# Patient Record
Sex: Male | Born: 1965 | Hispanic: Yes | Marital: Married | Smoking: Never smoker
Health system: Southern US, Community
[De-identification: ages and names within clinical notes are randomized; demographics above are authoritative.]

## PROBLEM LIST (undated history)

## (undated) DIAGNOSIS — I10 Essential (primary) hypertension: Secondary | ICD-10-CM

## (undated) HISTORY — PX: FOREARM SURGERY: SHX651

---

## 2022-04-01 ENCOUNTER — Encounter (HOSPITAL_BASED_OUTPATIENT_CLINIC_OR_DEPARTMENT_OTHER): Payer: Self-pay

## 2022-04-01 ENCOUNTER — Ambulatory Visit (HOSPITAL_BASED_OUTPATIENT_CLINIC_OR_DEPARTMENT_OTHER)
Admission: RE | Admit: 2022-04-01 | Discharge: 2022-04-01 | Disposition: A | Discharge status: Home / Self Care | Payer: 59 | Source: Ambulatory Visit | Attending: Family Medicine | Admitting: Family Medicine

## 2022-04-01 ENCOUNTER — Ambulatory Visit (INDEPENDENT_AMBULATORY_CARE_PROVIDER_SITE_OTHER): Payer: 59 | Admitting: Family Medicine

## 2022-04-01 ENCOUNTER — Encounter: Payer: Self-pay | Admitting: Family Medicine

## 2022-04-01 VITALS — BP 122/84 | Ht 66.0 in | Wt 180.0 lb

## 2022-04-01 DIAGNOSIS — M25531 Pain in right wrist: Secondary | ICD-10-CM | POA: Diagnosis present

## 2022-04-01 DIAGNOSIS — M47817 Spondylosis without myelopathy or radiculopathy, lumbosacral region: Secondary | ICD-10-CM

## 2022-04-01 DIAGNOSIS — M545 Low back pain, unspecified: Secondary | ICD-10-CM

## 2022-04-01 DIAGNOSIS — M8708 Idiopathic aseptic necrosis of bone, other site: Secondary | ICD-10-CM | POA: Diagnosis not present

## 2022-04-01 DIAGNOSIS — M8700 Idiopathic aseptic necrosis of unspecified bone: Secondary | ICD-10-CM | POA: Insufficient documentation

## 2022-04-01 MED ORDER — MELOXICAM 15 MG PO TABS: ORAL_TABLET | ORAL | 1 refills | Status: AC

## 2022-04-01 NOTE — Assessment & Plan Note (Signed)
Acute on chronic in nature.  Reports he had a fall in 2013 and had subsequent surgery.  Reports he received a steroid injection about 6 months ago but the pain has returned.  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  Unclear if he has previous surgery and that this pain is purely just related degenerative changes today. ?-Counseled on home exercise therapy and supportive care. ?-X-ray. ?-MR arthrogram to evaluate for avascular necrosis of the lunate and for presurgical planning. ?

## 2022-04-01 NOTE — Patient Instructions (Addendum)
Nice to meet you ?I have made a referral to physical therapy  ?We'll get the MRI at [facility]   ?Please send me a message in MyChart with any questions or updates.  ?We'll setup a virtual visit once the MRI is resulted .  ? ?--Dr. Raeford Razor ? ? ?Encantado de conocerlo ?He hecho una derivaci?n a fisioterapia. ?Haremos la resonancia magn?tica en [facility] ?Env?eme un mensaje en MyChart con cualquier pregunta o actualizaci?n. ?Programaremos una visita virtual una vez que se haya realizado la resonancia magn?tica. ?

## 2022-04-01 NOTE — Assessment & Plan Note (Signed)
Acute on chronic in nature.  Having degenerative changes of the facet joints. ?-Counseled on home exercise therapy and supportive care. ?-Referral to physical therapy. ?-Could consider further imaging. ?

## 2022-04-01 NOTE — Progress Notes (Signed)
?  Kevin Church - 56 y.o. male MRN [id number]  Date of birth: 08/18/1966 ? ?SUBJECTIVE:  Including CC & ROS.  ?No chief complaint on file. ? ? ?Kevin Church is a 56 y.o. male that is presenting with acute on chronic right wrist pain.  He had a fall from 10 years ago onto his wrist.  He had reported surgery at that time.  Presenting with pain that is occurring throughout the arm.  He is also presenting with acute on chronic low back pain. ? ?Interview was conducted with in person Spanish interpreter. ? ? ?Review of Systems ?See HPI  ? ?HISTORY: Past Medical, Surgical, Social, and Family History Reviewed & Updated per EMR.   ?Pertinent Historical Findings include: ? ?History reviewed. No pertinent past medical history. ? ?Past Surgical History:  ?Procedure Laterality Date  ? FOREARM SURGERY Right   ? ? ? ?PHYSICAL EXAM:  ?VS: BP 122/84 (BP Location: Left Arm, Patient Position: Sitting)   Ht 5\' 6"  (1.676 m)   Wt 180 lb (81.6 kg)   BMI 29.05 kg/m?  ?Physical Exam ?Gen: NAD, alert, cooperative with exam, well-appearing ?MSK:  ?Neurovascularly intact   ? ? ? ? ?ASSESSMENT & PLAN:  ? 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  Reports he had a fall in 2013 and had subsequent surgery.  Reports he received a steroid injection about 6 months ago but the pain has returned.  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  Unclear if he has previous surgery and that this pain is purely just related degenerative changes today. ?-Counseled on home exercise therapy and supportive care. ?-X-ray. ?-MR arthrogram to evaluate for avascular necrosis of the lunate and for presurgical planning. ? ?Facet arthropathy, lumbosacral ?Acute on chronic in nature.  Having degenerative changes of the facet joints. ?-Counseled on home exercise therapy and supportive care. ?-Referral to physical therapy. ?-Could consider further imaging. ? ? ? ? ?

## 2022-04-09 ENCOUNTER — Other Ambulatory Visit: Payer: Self-pay | Admitting: Family Medicine

## 2022-04-09 MED ORDER — DIAZEPAM 5 MG PO TABS: ORAL_TABLET | ORAL | 0 refills | Status: DC

## 2022-04-09 NOTE — Progress Notes (Signed)
Provided valium for MRI.  ? ?Myra Rude, MD ?[facility] ?04/09/2022, 3:52 PM ? ?

## 2022-04-13 ENCOUNTER — Ambulatory Visit (INDEPENDENT_AMBULATORY_CARE_PROVIDER_SITE_OTHER): Payer: 59

## 2022-04-13 ENCOUNTER — Ambulatory Visit: Payer: 59 | Admitting: Sports Medicine

## 2022-04-13 ENCOUNTER — Ambulatory Visit (INDEPENDENT_AMBULATORY_CARE_PROVIDER_SITE_OTHER): Payer: 59 | Admitting: Sports Medicine

## 2022-04-13 DIAGNOSIS — M87037 Idiopathic aseptic necrosis of right carpus: Secondary | ICD-10-CM | POA: Diagnosis not present

## 2022-04-13 DIAGNOSIS — M8700 Idiopathic aseptic necrosis of unspecified bone: Secondary | ICD-10-CM

## 2022-04-13 DIAGNOSIS — M25531 Pain in right wrist: Secondary | ICD-10-CM | POA: Diagnosis not present

## 2022-04-13 MED ORDER — GADOBUTROL 1 MMOL/ML IV SOLN
1.0000 mL | Freq: Once | INTRAVENOUS | Status: AC | PRN
  Administered 2022-04-13: 1 mL via INTRAVENOUS

## 2022-04-13 NOTE — Progress Notes (Signed)
? ? ?  Procedures performed today:   ? ?Procedure: Real-time Ultrasound Guided gadolinium contrast injection of right radiocarpal joint ?Device: Samsung HS60  ?Verbal informed consent obtained.  ?Time-out conducted.  ?Noted no overlying erythema, induration, or other signs of local infection.  ?Skin prepped in a sterile fashion.  ?Local anesthesia: Topical Ethyl chloride.  ?With sterile technique and under real time ultrasound guidance: Noted mild synovitis, 25-gauge needle advanced into the radiocarpal joint, injected 1/2 cc lidocaine, 1/2 cc kenalog 40, syringe switched and 0.05 gadolinium injected, syringe again switched and approximately 2 to 3 mL of sterile saline used to distend the joint. ?Joint visualized and capsule seen distending confirming intra-articular placement of contrast material and medication. ?Completed without difficulty  ?Advised to call if fevers/chills, erythema, induration, drainage, or persistent bleeding.  ?Images permanently stored in PACS ?Impression: Technically successful ultrasound guided gadolinium contrast injection for MR arthrography.  Please see separate MR arthrogram report. ? ? ?Independent interpretation of notes and tests performed by another provider:  ? ?None. ? ?Brief History, Exam, Impression, and Recommendations:   ? ?Avascular necrosis of bone of wrist ([place]) ?Patient seen by Dr. Raeford Razor, today we did an injection for MR arthrography, further management per primary treating provider. ? ? ? ?___________________________________________ ?Gwen Her. Dianah Field, M.D., ABFM., CAQSM. ?Primary Care and Sports Medicine ?[place] ? ?Adjunct Instructor of Family Medicine  ?[employer] ?

## 2022-04-13 NOTE — Assessment & Plan Note (Signed)
Patient seen by Dr. Raeford Razor, today we did an injection for MR arthrography, further management per primary treating provider. ?

## 2022-04-16 ENCOUNTER — Encounter: Payer: Self-pay | Admitting: Family Medicine

## 2022-04-16 ENCOUNTER — Ambulatory Visit: Payer: 59 | Admitting: Family Medicine

## 2022-04-16 VITALS — BP 140/88 | Ht 66.0 in | Wt 175.0 lb

## 2022-04-16 DIAGNOSIS — M8700 Idiopathic aseptic necrosis of unspecified bone: Secondary | ICD-10-CM

## 2022-04-16 DIAGNOSIS — M879 Osteonecrosis, unspecified: Secondary | ICD-10-CM

## 2022-04-16 NOTE — Assessment & Plan Note (Signed)
Acutely occurring.  The MRI of the straightening severe degenerative changes at the radiocarpal joint.  He has tried injections in the past. -Counseled on home exercise therapy and supportive care. -Provided brace today. -Referral to hand surgery.

## 2022-04-16 NOTE — Progress Notes (Signed)
  Kevin Church - 56 y.o. male MRN [id number]  Date of birth: 11-Nov-1966  SUBJECTIVE:  Including CC & ROS.  No chief complaint on file.   Kevin Church is a 56 y.o. male that is following up for the MRI of his right wrist.  The MRI of the wrist is demonstrating severe degenerative changes of the radiocarpal joint at the radiolunate articulation.  There is prominent subchondral cyst formation throughout the lunate and surrounding bone marrow edema..  An in person Spanish interpreter was used for this interview.  Review of Systems See HPI   HISTORY: Past Medical, Surgical, Social, and Family History Reviewed & Updated per EMR.   Pertinent Historical Findings include:  History reviewed. No pertinent past medical history.  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  The MRI of the straightening severe degenerative changes at the radiocarpal joint.  He has tried injections in the past. -Counseled on home exercise therapy and supportive care. -Provided brace today. -Referral to hand surgery.

## 2022-04-16 NOTE — Patient Instructions (Signed)
Good to see you I have sent in meloxicam.  Please check with the pharmacy to see if they received the prescription. Please use the brace as needed. I have made a referral to the hand surgeon. Please send me a message in MyChart with any questions or updates.  Please see me back as needed.   --Dr. Carlynn Spry bueno verte He enviado en meloxicam. Consulte con la farmacia para ver si recibieron la receta. Utilice la abrazadera segn sea necesario. He hecho una remisin al [employer]. Enveme un mensaje en MyChart con cualquier pregunta o actualizacin. Por favor, vuelva a verme cuando sea necesario.

## 2022-04-24 ENCOUNTER — Ambulatory Visit: Payer: 59 | Admitting: Orthopedic Surgery

## 2022-04-30 ENCOUNTER — Telehealth: Payer: Self-pay

## 2022-04-30 ENCOUNTER — Ambulatory Visit: Payer: 59 | Admitting: Orthopedic Surgery

## 2022-04-30 DIAGNOSIS — M19031 Primary osteoarthritis, right wrist: Secondary | ICD-10-CM | POA: Diagnosis not present

## 2022-04-30 DIAGNOSIS — M19039 Primary osteoarthritis, unspecified wrist: Secondary | ICD-10-CM

## 2022-04-30 DIAGNOSIS — M8708 Idiopathic aseptic necrosis of bone, other site: Secondary | ICD-10-CM

## 2022-04-30 DIAGNOSIS — M8700 Idiopathic aseptic necrosis of unspecified bone: Secondary | ICD-10-CM | POA: Diagnosis not present

## 2022-04-30 NOTE — Telephone Encounter (Signed)
I left a message at Kevin Church and Kevin Church for records for this patient. I didn't catch him before he left to have him sign anything, but if they call back this is why.

## 2022-05-01 DIAGNOSIS — M19039 Primary osteoarthritis, unspecified wrist: Secondary | ICD-10-CM | POA: Insufficient documentation

## 2022-05-01 NOTE — Progress Notes (Signed)
Office Visit Note   Patient: Kevin Church           Date of Birth: [birth year]           MRN: [id number] Visit Date: 04/30/2022              Requested by: Rosemarie Ax, MD [place] [address]  [place] [address] PCP: No primary care provider on file.   Assessment & Plan: Visit Diagnoses:  1. Avascular necrosis of bone of wrist ([place])   2. 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  Recent MRI shows collapse of the lunate without fragmentation but with complete loss of cartilage at the proximal articular surface of the lunate with some loss of cartilage at the lunate fossa of the radius.  We discussed treatment options including continued conservative management with bracing, oral anti-inflammatory medications, and corticosteroid injections versus surgery.  In terms of surgical procedures, he would most likely need either a proximal row carpectomy with or without capsular interposition depending on the state of the lunate fossa of the radius or a wrist arthrodesis.  He is a heavy labor which does plan to our decision making.  He had a previous procedure done in [place] and I would like to obtain those records before we decide what procedure to do.  It does seem like he had at least a PIN neurectomy based on the location of his previous incision.  We will work on taking his records and I will see him back after that point.  Follow-Up Instructions: No follow-ups on file.   Orders:  No orders of the defined types were placed in this encounter.  No orders of the defined types were placed in this encounter.     Procedures: No procedures performed   Clinical Data: No additional findings.   Subjective: Chief Complaint  Patient presents with   Right Wrist - Pain    This is a 56 year old right-hand-dominant male who works in [employer] and presents with  right wrist pain.  This started in 2013 after he fell from the ceiling onto his wrists.  He was seen at emergency room in [facility] and was told that he had a deformity of the bone and the bone did not have blood flow.  He subsequently developed right wrist pain and underwent corticosteroid injections in the wrist and the period between 2015 and 2016.  The injection lasted around 6 months.  The pain continued.  He had some sort of surgery in [place] but he is unsure which.  He does have a incision at the distal aspect of the forearm that seems consistent with a possible PIN neurectomy.  He was working with concrete and carpentry at time.  The surgery did not help him very much.  He has since been living with continued right wrist pain.  The wrist pain is diffuse but seems to be mostly in the dorsal central and radial aspects of the wrist.  He is taking meloxicam since the beginning of this month with some symptom improvement.  His pain is worse with prolonged activity.  He does have a brace that he wears for support.   Review of Systems   Objective: Vital Signs: There were no vitals taken for this visit.  Physical Exam Constitutional:      Appearance: Normal appearance.  Cardiovascular:     Rate and Rhythm:  Normal rate.     Pulses: Normal pulses.  Pulmonary:     Effort: Pulmonary effort is normal.  Skin:    General: Skin is warm and dry.     Capillary Refill: Capillary refill takes less than 2 seconds.  Neurological:     Mental Status: He is alert.    Right Hand Exam   Tenderness  Right hand tenderness location: TTP at dorsal central wrist, around anatomic snuffbox, and volar central wrist.  No obvious swelling.  Other  Erythema: absent Sensation: normal Pulse: present  Comments:  Wrist ROM 60/30 compared to 70/45 at contralateral side.  Near full pronation/supination.  No pain w/ [facility] grind test.  No wrist instability with ROM.       Specialty Comments:  No specialty  comments available.  Imaging: No results found.   PMFS History: Patient Active Problem List   Diagnosis Date Noted   Wrist arthritis 05/01/2022   Avascular necrosis of bone of wrist (Kevin Church) 04/01/2022   Facet arthropathy, lumbosacral 04/01/2022   No past medical history on file.  No family history on file.  Past Surgical History:  Procedure Laterality Date   FOREARM SURGERY Right    Social History   Occupational History   Not on file  Tobacco Use   Smoking status: Not on file   Smokeless tobacco: Not on file  Substance and Sexual Activity   Alcohol use: Not on file   Drug use: Not on file   Sexual activity: Not on file

## 2022-05-07 NOTE — Therapy (Incomplete)
OUTPATIENT PHYSICAL THERAPY THORACOLUMBAR EVALUATION   Patient Name: Kevin Church MRN: [id number] DOB:1966-04-15, 56 y.o., male Today's Date: 05/07/2022    No past medical history on file. 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  ONSET DATE: ***  SUBJECTIVE:                                                                                                                                                                                           SUBJECTIVE STATEMENT: ***  PERTINENT HISTORY:  R forearm sx, R wrist AVN and OA  PAIN:  Are you having pain? Yes: {yespain:27235::"NPRS scale: ***/10","Pain location: ***","Pain description: ***","Aggravating factors: ***","Relieving factors: ***"}   PRECAUTIONS: {Therapy precautions:24002}  WEIGHT BEARING RESTRICTIONS {Yes ***/No:24003}  FALLS:  Has patient fallen in last 6 months? {fallsyesno:27318}  LIVING ENVIRONMENT: Lives with: {OPRC lives with:25569::"lives with their family"} Lives in: {Lives in:25570} Stairs: {opstairs:27293} Has following equipment at home: {Assistive devices:23999}  OCCUPATION: ***  PLOF: {PLOF:24004}  PATIENT GOALS: ***   OBJECTIVE:   DIAGNOSTIC FINDINGS:  04/01/2022 - Lumbar x-ray: Mild lumbar disc degeneration.  Negative for fracture.  PATIENT SURVEYS:  {rehab surveys:24030}  SCREENING FOR RED FLAGS: Bowel or bladder incontinence: {Yes/No:304960894} Spinal tumors: {Yes/No:304960894} Cauda equina syndrome: {Yes/No:304960894} Compression  fracture: {Yes/No:304960894} Abdominal aneurysm: {Yes/No:304960894}  COGNITION:  Overall cognitive status: {cognition:24006}     SENSATION: {sensation:27233}  MUSCLE LENGTH: Hamstrings: Right *** deg; Left *** deg Thomas test: Right *** deg; Left *** deg  POSTURE: {posture:25561}  PALPATION: ***  LUMBAR ROM:   {AROM/PROM:27142}  A/PROM  eval  Flexion   Extension   Right lateral flexion   Left lateral flexion   Right rotation   Left rotation    (Blank rows = not tested)  LOWER EXTREMITY ROM:     {AROM/PROM:27142}  Right eval Left eval  Hip flexion    Hip extension    Hip abduction    Hip adduction    Hip internal rotation    Hip external rotation    Knee flexion    Knee extension    Ankle dorsiflexion    Ankle plantarflexion    Ankle inversion    Ankle eversion     (Blank rows =  not tested)  LOWER EXTREMITY MMT:    MMT Right eval Left eval  Hip flexion    Hip extension    Hip abduction    Hip adduction    Hip internal rotation    Hip external rotation    Knee flexion    Knee extension    Ankle dorsiflexion    Ankle plantarflexion    Ankle inversion    Ankle eversion     (Blank rows = not tested)  LUMBAR SPECIAL TESTS:  {lumbar special test:25242}  FUNCTIONAL TESTS:  {Functional tests:24029}  GAIT: Distance walked: *** Assistive device utilized: {Assistive devices:23999} Level of assistance: {Levels of assistance:24026} Comments: ***    TODAY'S TREATMENT  ***   PATIENT EDUCATION:  Education details: {Education details:27468} Person educated: {Person educated:25204} Education method: {Education Method:25205} Education comprehension: {Education Comprehension:25206}   HOME EXERCISE PROGRAM: ***  ASSESSMENT:  CLINICAL IMPRESSION: Kevin Church is a 56 y.o. male who was seen today for physical therapy evaluation and treatment for ***.    OBJECTIVE IMPAIRMENTS {opptimpairments:25111}.   ACTIVITY LIMITATIONS  {activitylimitations:27494}  PARTICIPATION LIMITATIONS: {participationrestrictions:25113}  PERSONAL FACTORS {Personal factors:25162} are also affecting patient's functional outcome.   REHAB POTENTIAL: {rehabpotential:25112}  CLINICAL DECISION MAKING: {clinical decision making:25114}  EVALUATION COMPLEXITY: {Evaluation complexity:25115}   GOALS: Goals reviewed with patient? {yes/no:20286}  SHORT TERM GOALS: Target date: {follow up:25551} (Remove Blue Hyperlink)  Patient will be independent with initial HEP.  Baseline: *** Goal status: {GOALSTATUS:25110}  2.  Patient will report centralization of radicular symptoms.  Baseline: *** Goal status: {GOALSTATUS:25110}  3.  *** Baseline: *** Goal status: {GOALSTATUS:25110}   LONG TERM GOALS: Target date: {follow up:25551}  (Remove Blue Hyperlink)  Patient will be independent with advanced/ongoing HEP to improve outcomes and carryover.  Baseline: *** Goal status: {GOALSTATUS:25110}  2.  Patient will report 75% improvement in low back pain to improve QOL.  Baseline: *** Goal status: {GOALSTATUS:25110}  3.  Patient to demonstrate ability to achieve and maintain good spinal alignment/posturing and body mechanics needed for daily activities. Baseline: *** Goal status: {GOALSTATUS:25110}  4.  Patient will demonstrate full pain free lumbar ROM to perform ADLs.   Baseline: *** Goal status: {GOALSTATUS:25110}  5.  Patient will demonstrate improved functional strength as demonstrated by ***. Baseline: *** Goal status: {GOALSTATUS:25110}  6.  Patient will report *** on lumbar FOTO to demonstrate improved functional ability.  Baseline: *** Goal status: {GOALSTATUS:25110}   7.  Patient will tolerate *** min of (standing/sitting/walking) to perform ***. Baseline: *** Goal status: {GOALSTATUS:25110}  8.  *** Baseline: *** Goal status: {GOALSTATUS:25110}    PLAN: PT FREQUENCY: {rehab frequency:25116}  PT DURATION: {rehab  duration:25117}  PLANNED INTERVENTIONS: {rehab planned interventions:25118::"Therapeutic exercises","Therapeutic activity","Neuromuscular re-education","Balance training","Gait training","Patient/Family education","Joint mobilization"}.  PLAN FOR NEXT SESSION: ***   Marry Guan, PT 05/07/2022, 2:28 PM

## 2022-05-08 ENCOUNTER — Ambulatory Visit: Payer: 59 | Attending: Family Medicine | Admitting: Physical Therapy

## 2022-05-08 DIAGNOSIS — R2689 Other abnormalities of gait and mobility: Secondary | ICD-10-CM | POA: Insufficient documentation

## 2022-05-08 DIAGNOSIS — M5416 Radiculopathy, lumbar region: Secondary | ICD-10-CM | POA: Insufficient documentation

## 2022-05-08 DIAGNOSIS — R29898 Other symptoms and signs involving the musculoskeletal system: Secondary | ICD-10-CM | POA: Insufficient documentation

## 2022-05-08 DIAGNOSIS — M5459 Other low back pain: Secondary | ICD-10-CM | POA: Insufficient documentation

## 2022-05-08 DIAGNOSIS — M62830 Muscle spasm of back: Secondary | ICD-10-CM | POA: Insufficient documentation

## 2022-05-14 NOTE — Therapy (Incomplete)
OUTPATIENT PHYSICAL THERAPY THORACOLUMBAR EVALUATION   Patient Name: Kevin Church MRN: [id number] DOB:[birth year], 56 y.o., male Today's Date: 05/14/2022    No past medical history on file. 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  ONSET DATE: ***  SUBJECTIVE:                                                                                                                                                                                           SUBJECTIVE STATEMENT: ***  PERTINENT HISTORY:  R forearm sx, R wrist AVN and OA  PAIN:  Are you having pain? Yes: {yespain:27235::"NPRS scale: ***/10","Pain location: ***","Pain description: ***","Aggravating factors: ***","Relieving factors: ***"}   PRECAUTIONS: {Therapy precautions:24002}  WEIGHT BEARING RESTRICTIONS {Yes ***/No:24003}  FALLS:  Has patient fallen in last 6 months? {fallsyesno:27318}  LIVING ENVIRONMENT: Lives with: {OPRC lives with:25569::"lives with their family"} Lives in: {Lives in:25570} Stairs: {opstairs:27293} Has following equipment at home: {Assistive devices:23999}  OCCUPATION: ***  PLOF: {PLOF:24004}  PATIENT GOALS: ***   OBJECTIVE:   DIAGNOSTIC FINDINGS:  04/01/2022 - Lumbar x-ray: Mild lumbar disc degeneration.  Negative for fracture.  PATIENT SURVEYS:  {rehab surveys:24030}  SCREENING FOR RED FLAGS: Bowel or bladder incontinence: {Yes/No:304960894} Spinal tumors: {Yes/No:304960894} Cauda equina syndrome: {Yes/No:304960894} Compression  fracture: {Yes/No:304960894} Abdominal aneurysm: {Yes/No:304960894}  COGNITION:  Overall cognitive status: {cognition:24006}     SENSATION: {sensation:27233}  MUSCLE LENGTH: Hamstrings: Right *** deg; Left *** deg Thomas test: Right *** deg; Left *** deg  POSTURE: {posture:25561}  PALPATION: ***  LUMBAR ROM:   {AROM/PROM:27142}  A/PROM  eval  Flexion   Extension   Right lateral flexion   Left lateral flexion   Right rotation   Left rotation    (Blank rows = not tested)  LOWER EXTREMITY ROM:     {AROM/PROM:27142}  Right eval Left eval  Hip flexion    Hip extension    Hip abduction    Hip adduction    Hip internal rotation    Hip external rotation    Knee flexion    Knee extension    Ankle dorsiflexion    Ankle plantarflexion    Ankle inversion    Ankle eversion     (Blank rows =  not tested)  LOWER EXTREMITY MMT:    MMT Right eval Left eval  Hip flexion    Hip extension    Hip abduction    Hip adduction    Hip internal rotation    Hip external rotation    Knee flexion    Knee extension    Ankle dorsiflexion    Ankle plantarflexion    Ankle inversion    Ankle eversion     (Blank rows = not tested)  LUMBAR SPECIAL TESTS:  {lumbar special test:25242}  FUNCTIONAL TESTS:  {Functional tests:24029}  GAIT: Distance walked: *** Assistive device utilized: {Assistive devices:23999} Level of assistance: {Levels of assistance:24026} Comments: ***    TODAY'S TREATMENT  ***   PATIENT EDUCATION:  Education details: {Education details:27468} Person educated: {Person educated:25204} Education method: {Education Method:25205} Education comprehension: {Education Comprehension:25206}   HOME EXERCISE PROGRAM: ***  ASSESSMENT:  CLINICAL IMPRESSION: Kevin Church is a 56 y.o. male who was seen today for physical therapy evaluation and treatment for ***.    OBJECTIVE IMPAIRMENTS {opptimpairments:25111}.   ACTIVITY LIMITATIONS  {activitylimitations:27494}  PARTICIPATION LIMITATIONS: {participationrestrictions:25113}  PERSONAL FACTORS {Personal factors:25162} are also affecting patient's functional outcome.   REHAB POTENTIAL: {rehabpotential:25112}  CLINICAL DECISION MAKING: {clinical decision making:25114}  EVALUATION COMPLEXITY: {Evaluation complexity:25115}   GOALS: Goals reviewed with patient? {yes/no:20286}  SHORT TERM GOALS: Target date: {follow up:25551} (Remove Blue Hyperlink)  Patient will be independent with initial HEP.  Baseline: *** Goal status: {GOALSTATUS:25110}  2.  Patient will report centralization of radicular symptoms.  Baseline: *** Goal status: {GOALSTATUS:25110}  3.  *** Baseline: *** Goal status: {GOALSTATUS:25110}   LONG TERM GOALS: Target date: {follow up:25551}  (Remove Blue Hyperlink)  Patient will be independent with advanced/ongoing HEP to improve outcomes and carryover.  Baseline: *** Goal status: {GOALSTATUS:25110}  2.  Patient will report 75% improvement in low back pain to improve QOL.  Baseline: *** Goal status: {GOALSTATUS:25110}  3.  Patient to demonstrate ability to achieve and maintain good spinal alignment/posturing and body mechanics needed for daily activities. Baseline: *** Goal status: {GOALSTATUS:25110}  4.  Patient will demonstrate full pain free lumbar ROM to perform ADLs.   Baseline: *** Goal status: {GOALSTATUS:25110}  5.  Patient will demonstrate improved functional strength as demonstrated by ***. Baseline: *** Goal status: {GOALSTATUS:25110}  6.  Patient will report *** on lumbar FOTO to demonstrate improved functional ability.  Baseline: *** Goal status: {GOALSTATUS:25110}   7.  Patient will tolerate *** min of (standing/sitting/walking) to perform ***. Baseline: *** Goal status: {GOALSTATUS:25110}  8.  *** Baseline: *** Goal status: {GOALSTATUS:25110}    PLAN: PT FREQUENCY: {rehab frequency:25116}  PT DURATION: {rehab  duration:25117}  PLANNED INTERVENTIONS: {rehab planned interventions:25118::"Therapeutic exercises","Therapeutic activity","Neuromuscular re-education","Balance training","Gait training","Patient/Family education","Joint mobilization"}.  PLAN FOR NEXT SESSION: ***   Marry Guan, PT 05/14/2022, 8:21 AM

## 2022-05-15 ENCOUNTER — Ambulatory Visit: Payer: 59

## 2022-05-22 ENCOUNTER — Encounter: Payer: Self-pay | Admitting: Physical Therapy

## 2022-05-22 ENCOUNTER — Other Ambulatory Visit: Payer: Self-pay

## 2022-05-22 ENCOUNTER — Ambulatory Visit: Payer: 59 | Admitting: Physical Therapy

## 2022-05-22 DIAGNOSIS — M62830 Muscle spasm of back: Secondary | ICD-10-CM | POA: Diagnosis present

## 2022-05-22 DIAGNOSIS — M5416 Radiculopathy, lumbar region: Secondary | ICD-10-CM | POA: Diagnosis present

## 2022-05-22 DIAGNOSIS — R2689 Other abnormalities of gait and mobility: Secondary | ICD-10-CM

## 2022-05-22 DIAGNOSIS — M5459 Other low back pain: Secondary | ICD-10-CM

## 2022-05-22 DIAGNOSIS — R29898 Other symptoms and signs involving the musculoskeletal system: Secondary | ICD-10-CM | POA: Diagnosis present

## 2022-05-25 ENCOUNTER — Ambulatory Visit: Payer: 59 | Admitting: Physical Therapy

## 2022-05-25 ENCOUNTER — Encounter: Payer: Self-pay | Admitting: Physical Therapy

## 2022-05-25 DIAGNOSIS — M62830 Muscle spasm of back: Secondary | ICD-10-CM

## 2022-05-25 DIAGNOSIS — M5459 Other low back pain: Secondary | ICD-10-CM | POA: Diagnosis not present

## 2022-05-25 DIAGNOSIS — R29898 Other symptoms and signs involving the musculoskeletal system: Secondary | ICD-10-CM

## 2022-05-25 DIAGNOSIS — M5416 Radiculopathy, lumbar region: Secondary | ICD-10-CM

## 2022-05-25 DIAGNOSIS — R2689 Other abnormalities of gait and mobility: Secondary | ICD-10-CM

## 2022-05-29 ENCOUNTER — Encounter: Payer: 59 | Admitting: Physical Therapy

## 2022-06-05 ENCOUNTER — Ambulatory Visit: Payer: 59 | Attending: Family Medicine | Admitting: Physical Therapy

## 2022-06-05 ENCOUNTER — Encounter: Payer: Self-pay | Admitting: Physical Therapy

## 2022-06-05 DIAGNOSIS — R2689 Other abnormalities of gait and mobility: Secondary | ICD-10-CM | POA: Diagnosis present

## 2022-06-05 DIAGNOSIS — M5459 Other low back pain: Secondary | ICD-10-CM | POA: Diagnosis present

## 2022-06-05 DIAGNOSIS — R29898 Other symptoms and signs involving the musculoskeletal system: Secondary | ICD-10-CM

## 2022-06-05 DIAGNOSIS — M5416 Radiculopathy, lumbar region: Secondary | ICD-10-CM

## 2022-06-05 DIAGNOSIS — M62830 Muscle spasm of back: Secondary | ICD-10-CM | POA: Diagnosis present

## 2022-06-05 NOTE — Therapy (Signed)
OUTPATIENT PHYSICAL THERAPY TREATMENT   Patient Name: Kevin Church MRN: [id number] DOB:1966/09/23, 56 y.o., male Today's Date: 06/05/2022   PT End of Session - 06/05/22 1006     Visit Number 3    Date for PT Re-Evaluation 07/03/22    Authorization Type [employer]    PT Start Time 1010    PT Stop Time 1059    PT Time Calculation (min) 49 min    Activity Tolerance Patient tolerated treatment well;Patient limited by pain    Behavior During Therapy [facility] for tasks assessed/performed              History reviewed. No pertinent past medical history. 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) 04/01/2022   Facet arthropathy, lumbosacral 04/01/2022    PCP: Not in system & patient cannot recall name  REFERRING PROVIDER: Myra Rude, MD  REFERRING DIAG: [phone] (ICD-10-CM) - Facet arthropathy, lumbosacral  RATIONALE FOR EVALUATION AND TREATMENT:  Rehabilitation  THERAPY DIAG:  Other low back pain  Radiculopathy, lumbar region  Other symptoms and signs involving the musculoskeletal system  Muscle spasm of back  ONSET DATE: ~1 month  SUBJECTIVE:                                                                                                                                                                                           SUBJECTIVE STATEMENT: Via spanish interpreter, pt reports continued pain. Pain is in low back and radiating to groin bil.  All of the exercises increase his pain and .   PAIN:  Are you having pain? Yes: NPRS scale: 8/10 Pain location: midline low back with pain and numbness down B posterior LEs to toes  Pain description: constant needles Aggravating factors: bending forward, sit to stand, exercise, playing/kicking ball with his kids Relieving factors: ibuprofen, ice  pack, Tiger balm, back brace  PERTINENT HISTORY:  R wrist AVN & OA - currently conservative treatment in wrist splint but being evaluated by ortho to determine potential procedural intervention, h/o R forearm sx  PRECAUTIONS: None  WEIGHT BEARING RESTRICTIONS: Yes - R wrist in splint due to OA/AVN  FALLS:  Has patient fallen in last 6 months? 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  Negative for fracture.  PATIENT SURVEYS:  FOTO Lumbar = 26, predicted = 65  SCREENING FOR RED FLAGS: Bowel or bladder incontinence: No Spinal tumors: No Cauda equina syndrome: No Compression fracture: No Abdominal aneurysm: No  COGNITION:  Overall cognitive status: Within functional limits for tasks assessed     SENSATION: WFL Intermittent B LE radicular pain and numbness down posterior LEs to toes  MUSCLE LENGTH:  Hamstrings: mod/severe tight B ITB: mod tight B Piriformis: mod tight B Hip flexors: mild tight B Quads: mild tight B  POSTURE: decreased lumbar lordosis  PALPATION: Increased muscle tension and TTP in B lower lumbar paraspinals, QL and to a lesser degree in B upper glutes Lower lumbar vertebrae hypomobile with CPAs with increased pain reported  LUMBAR ROM:   Active  AROM  Eval - 05/22/22  Flexion Hands to lower thighs - increased LBP & LE pain  Extension 50% limited - increased LBP but less LE pain  Right lateral flexion Hand to lower thigh - increased LBP  Left lateral flexion Hand to lower thigh - increased LBP  Right  rotation 50% limited - increased LBP   Left rotation 50% limited - increased LBP    (Blank rows = not tested)  LOWER EXTREMITY ROM:    B LE ROM grossly [facility]  LOWER EXTREMITY MMT:    MMT Right Eval 05/22/22 Left Eval 05/22/22  Hip flexion 4+ 4+  Hip extension 4 4  Hip abduction 4 4  Hip adduction 4 4  Hip internal rotation 4+ 4+  Hip external rotation 4 4  Knee flexion 4 4  Knee extension 4+ 4+  Ankle dorsiflexion 4 4  Ankle plantarflexion    Ankle inversion    Ankle eversion     (Blank rows = not tested) *Increased LBP with majority of resisted MMT  TODAY'S TREATMENT  06/05/2022  Therapeutic Exercise: to improve strength and mobility.  Demo and verbal cues throughout for technique. Nustep L4 x 8 min (taking subjective also educating on dry needling during this time.) Seated TMR based exercses - upper trunk twist preference to L, x 10, x 5 - pain started to increase.  Arm raises preference L x 10, second set started to increased LBP again.  Leg raises - preference L - x 5.   Mechanical  Lumbar Traction x  20 min (15 min treatment time) started after 50 lbs max slowly increasing to 80lbs max and 60 lb min, 60 sec on/10 sec rest, to decrease radicular symptoms and pain.  2 steps up, 2 steps down, 20 deg decline.    05/25/22 THERAPEUTIC EXERCISE: to improve flexibility, strength and mobility.  Verbal and tactile cues throughout for technique. Seated 3-way lumbar flexion stretch - increased pain reported TrA + PPT 10 x 5" - increased pain LTR 5 x 5' - increased pain POE 2 x 30 sec  MANUAL THERAPY: To promote normalized muscle tension, improved flexibility, improved joint mobility, and reduced pain. Manual longitudinal R/L LE distraction in supine - increased pain reported Hooklying manual lumbar traction/distraction - increased pain Foam roller to lumbar paraspinals - no increased or decreased pain Grade I-II CPAs & UPAs to lumbar vertebrae - increased pain  MODALITIES:  IFC  to B lumbar paraspinals, 80-150 Hz, intensity to patient tolerance x  10 min Ice pack to lumbar paraspinals x 10 min   05/22/22 THERAPEUTIC EXERCISE: Instruction in initial HEP (see below) to improve flexibility, strength and mobility.  Verbal and tactile cues throughout for technique. Static Prone on Elbows 2 x 30 sec Attempted prone press-up but unable due to R wrist Seated 3-way lumbar flexion stretch with hands supported on chair x 30 sec for each position   PATIENT EDUCATION:  Education details:  education on dry needling, given TMR exercises to be done to tolerance   Person educated: Patient Education method: Programmer, multimedia, Facilities manager, Verbal cues, and Handouts (handouts printed in Spanish) Education comprehension: verbalized understanding and needs further education   HOME EXERCISE PROGRAM: Access Code: [email or web address]  (Spanish translation) URL: [email or web address] Date: 05/22/2022 Prepared by: Glenetta Hew  Exercises - Static Prone on Elbows  - 3 x daily - 7 x weekly - 2 sets - 3 reps - 30-60 sec hold - Seated Flexion Stretch with Swiss Ball  - 2 x daily - 7 x weekly - 3 reps - 30 sec hold - Seated Thoracic Flexion and Rotation with Swiss Ball  - 2 x daily - 7 x weekly - 3 reps - 30 sec hold  ASSESSMENT:  CLINICAL IMPRESSION: Kowen reports has not done HEP due to pain and that he was told not to do exercises.  He reports continued severe LBP.  Today trialed TMR exercises focusing on side of preference, able to do 1 set of upper trunk twist, leg raise and arm raise on side of preference, but increased pain with second set in LB.  Educated on dry needling today as intervention in future appt.  We trialed mechanical lumbar traction today, starting at 50lbs max - he reported feeling no pull, so slowly increased pull to his tolerance, still below 50% body weight.  While he reported no pain or discomfort during traction (attended at all times by both PT and translator)  after traction ended complained of numbness down to toes.  After sitting up and ankle pumps, no numbness but reported worsened low back pain.  Offered modalities but declined "I just want to go home."  Contacted referring MD for him about severe pain and poor tolerance to exercise.  Given new exercises with instructions to perform on "good" side but stop when starts to hurt, aim is to start gentle movements without pain to improve exercise tolerance.  Noeh Hosollos-bamaca continues to demonstrate potential for improvement and would benefit from continued skilled therapy to address impairments.       OBJECTIVE IMPAIRMENTS Abnormal gait, decreased activity tolerance, decreased endurance, decreased knowledge of condition, decreased mobility, difficulty walking, decreased ROM, decreased strength, decreased safety awareness, increased fascial restrictions, impaired perceived functional ability, increased muscle spasms, impaired flexibility, impaired sensation, improper body mechanics, postural dysfunction, and pain.   ACTIVITY LIMITATIONS carrying, lifting, bending, sitting, standing, squatting, sleeping, stairs, transfers, bed mobility, locomotion level, and caring for others  PARTICIPATION LIMITATIONS: meal prep, cleaning, laundry, medication management, interpersonal relationship, driving, shopping, community activity, occupation, and yard work  PERSONAL Insurance underwriter, Fitness, Past/current experiences, Profession, Time since onset of injury/illness/exacerbation, and 1-2 comorbidities: R wrist AVN/OA and h/o R forearm sx  are also affecting patient's functional outcome.   REHAB POTENTIAL: Good  CLINICAL DECISION MAKING: Evolving/moderate complexity  EVALUATION COMPLEXITY: Moderate   GOALS: Goals reviewed with patient? Yes  SHORT TERM GOALS: Target date: 06/12/2022   Patient will be independent with initial HEP.  Baseline: Initial HEP provided on eval  Goal status: IN  PROGRESS  2.   Patient will report centralization of radicular symptoms.  Baseline: Near constant B LE sciatica with intermittent B LE numbness down posterior LEs to toes Goal status: IN PROGRESS  LONG TERM GOALS: Target date: 07/03/2022    Patient will be independent with advanced/ongoing HEP to improve outcomes and carryover.  Baseline: Initial HEP provided on eval  Goal status: IN PROGRESS  2.  Patient will report 75% improvement in low back pain to improve QOL.  Baseline: 9/10 Goal status: IN PROGRESS  3.  Patient to demonstrate ability to achieve and maintain good spinal alignment/posturing and body mechanics needed for daily activities. Baseline:  Goal status: IN PROGRESS  4.  Patient will demonstrate full pain free lumbar ROM to perform ADLs.   Baseline: Refer to above lumbar ROM chart Goal status: IN PROGRESS  5.  Patient will demonstrate improved B LE strength to >/= 4+/5 for improved stability and ease of mobility . Baseline: Refer to above LE MMT chart Goal status: IN PROGRESS  6.  Patient will report 80 on lumbar FOTO to demonstrate improved functional ability.  Baseline: 26 Goal status: IN PROGRESS   7.  Patient will report ability to play and kick a ball with his kids w/o limitation due to LBP or LE radiculopathy. Baseline: Unable to play with his kids d/t pain Goal status: IN PROGRESS  8.  Patient to report ability to perform ADLs, household, and work-related tasks without limitation due to LBP or LE radicular pain, LOM or weakness. Baseline: Limited with most daily tasks and unable to work d/t LBP/LE radiculopathy Goal status: IN PROGRESS    PLAN: PT FREQUENCY: 2x/week  PT DURATION: 6 weeks  PLANNED INTERVENTIONS: Therapeutic exercises, Therapeutic activity, Neuromuscular re-education, Balance training, Gait training, Patient/Family education, Joint mobilization, Aquatic Therapy, Dry Needling, Electrical stimulation, Spinal mobilization, Cryotherapy, Moist heat, Taping,  Traction, Ultrasound, Ionotophoresis 4mg /ml Dexamethasone, Manual therapy, and Re-evaluation.  PLAN FOR NEXT SESSION: trial of estim/TENS during exercises; review initial HEP & progress/modify as indicated; core/lumbopelvic mobilization and strengthening as tolerated ; MT +/- DN and/or modalities   , PT, DPT  06/05/2022, 11:58 AM

## 2022-06-09 ENCOUNTER — Ambulatory Visit: Payer: 59

## 2022-06-09 DIAGNOSIS — R29898 Other symptoms and signs involving the musculoskeletal system: Secondary | ICD-10-CM

## 2022-06-09 DIAGNOSIS — M62830 Muscle spasm of back: Secondary | ICD-10-CM

## 2022-06-09 DIAGNOSIS — M5459 Other low back pain: Secondary | ICD-10-CM | POA: Diagnosis not present

## 2022-06-09 DIAGNOSIS — R2689 Other abnormalities of gait and mobility: Secondary | ICD-10-CM

## 2022-06-09 DIAGNOSIS — M5416 Radiculopathy, lumbar region: Secondary | ICD-10-CM

## 2022-06-09 NOTE — Therapy (Signed)
OUTPATIENT PHYSICAL THERAPY TREATMENT   Patient Name: Kevin Church MRN: [id number] DOB:[birth year], 56 y.o., male Today's Date: 06/09/2022   PT End of Session - 06/09/22 1526     Visit Number 4    Date for PT Re-Evaluation 07/03/22    Authorization Type [employer]    PT Start Time 1449    PT Stop Time 1526    PT Time Calculation (min) 37 min    Activity Tolerance Patient tolerated treatment well;Patient limited by pain    Behavior During Therapy [facility] for tasks assessed/performed               History reviewed. No pertinent past medical history. 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) 04/01/2022   Facet arthropathy, lumbosacral 04/01/2022    PCP: Not in system & patient cannot recall name  REFERRING PROVIDER: Myra Rude, MD  REFERRING DIAG: [phone] (ICD-10-CM) - Facet arthropathy, lumbosacral  RATIONALE FOR EVALUATION AND TREATMENT:  Rehabilitation  THERAPY DIAG:  Other low back pain  Radiculopathy, lumbar region  Other symptoms and signs involving the musculoskeletal system  Muscle spasm of back  Other abnormalities of gait and mobility  ONSET DATE: ~1 month  SUBJECTIVE:                                                                                                                                                                                           SUBJECTIVE STATEMENT: Via spanish interpreter, pt reports constant LBP.  PAIN:  Are you having pain? Yes: NPRS scale: 9/10 Pain location: midline low back with pain and numbness down B posterior LEs to toes  Pain description: constant needles Aggravating factors: bending forward, sit to stand, exercise, playing/kicking ball with his kids Relieving factors: ibuprofen, ice pack, Tiger balm, back brace  PERTINENT HISTORY:   R wrist AVN & OA - currently conservative treatment in wrist splint but being evaluated by ortho to determine potential procedural intervention, h/o R forearm sx  PRECAUTIONS: None  WEIGHT BEARING RESTRICTIONS: Yes - R wrist in splint due to OA/AVN  FALLS:  Has patient fallen in last 6 months? 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  Negative for fracture.  PATIENT SURVEYS:  FOTO Lumbar = 26, predicted = 65  SCREENING FOR RED FLAGS: Bowel or bladder incontinence: No Spinal tumors: No Cauda equina syndrome: No Compression fracture: No Abdominal aneurysm: No  COGNITION:  Overall cognitive status: Within functional limits for tasks assessed     SENSATION: WFL Intermittent B LE radicular pain and numbness down posterior LEs to toes  MUSCLE LENGTH:  Hamstrings: mod/severe tight B ITB: mod tight B Piriformis: mod tight B Hip flexors: mild tight B Quads: mild tight B  POSTURE: decreased lumbar lordosis  PALPATION: Increased muscle tension and TTP in B lower lumbar paraspinals, QL and to a lesser degree in B upper glutes Lower lumbar vertebrae hypomobile with CPAs with increased pain reported  LUMBAR ROM:   Active  AROM  Eval - 05/22/22  Flexion Hands to lower thighs - increased LBP & LE pain  Extension 50% limited - increased LBP but less LE pain  Right lateral flexion Hand to lower thigh - increased LBP  Left lateral flexion Hand to lower thigh - increased LBP  Right rotation 50% limited - increased LBP   Left  rotation 50% limited - increased LBP    (Blank rows = not tested)  LOWER EXTREMITY ROM:    B LE ROM grossly [facility]  LOWER EXTREMITY MMT:    MMT Right Eval 05/22/22 Left Eval 05/22/22  Hip flexion 4+ 4+  Hip extension 4 4  Hip abduction 4 4  Hip adduction 4 4  Hip internal rotation 4+ 4+  Hip external rotation 4 4  Knee flexion 4 4  Knee extension 4+ 4+  Ankle dorsiflexion 4 4  Ankle plantarflexion    Ankle inversion    Ankle eversion     (Blank rows = not tested) *Increased LBP with majority of resisted MMT  TODAY'S TREATMENT  06/09/22 Therapeutic Exercise: Seated LAQ 2x10 bil Seated marching 2x10 bil Seated row red TB x 10 Seated shld extension red TB x 10 Seated hip ADD ball squeeze x 10 Seated row machine 15# x 10 Knee flexion 20# x 10  06/05/2022  Therapeutic Exercise: to improve strength and mobility.  Demo and verbal cues throughout for technique. Nustep L4 x 8 min (taking subjective also educating on dry needling during this time.) Seated TMR based exercses - upper trunk twist preference to L, x 10, x 5 - pain started to increase.  Arm raises preference L x 10, second set started to increased LBP again.  Leg raises - preference L - x 5.   Mechanical  Lumbar Traction x  20 min (15 min treatment time) started after 50 lbs max slowly increasing to 80lbs max and 60 lb min, 60 sec on/10 sec rest, to decrease radicular symptoms and pain.  2 steps up, 2 steps down, 20 deg decline.    05/25/22 THERAPEUTIC EXERCISE: to improve flexibility, strength and mobility.  Verbal and tactile cues throughout for technique. Seated 3-way lumbar flexion stretch - increased pain reported TrA + PPT 10 x 5" - increased pain LTR 5 x 5' - increased pain POE 2 x 30 sec  MANUAL THERAPY: To promote normalized muscle tension, improved flexibility, improved joint mobility, and reduced pain. Manual longitudinal R/L LE distraction in supine - increased pain reported Hooklying manual lumbar  traction/distraction - increased pain Foam roller to lumbar paraspinals - no  increased or decreased pain Grade I-II CPAs & UPAs to lumbar vertebrae - increased pain  MODALITIES:  IFC to B lumbar paraspinals, 80-150 Hz, intensity to patient tolerance x 10 min Ice pack to lumbar paraspinals x 10 min   05/22/22 THERAPEUTIC EXERCISE: Instruction in initial HEP (see below) to improve flexibility, strength and mobility.  Verbal and tactile cues throughout for technique. Static Prone on Elbows 2 x 30 sec Attempted prone press-up but unable due to R wrist Seated 3-way lumbar flexion stretch with hands supported on chair x 30 sec for each position   PATIENT EDUCATION:  Education details: HEP update (seated row and extension) Person educated: Patient Education method: Explanation, Demonstration, Verbal cues, and Handouts  Education comprehension: verbalized understanding and needs further education   HOME EXERCISE PROGRAM: Access Code: [email or web address]  (Spanish translation)   ASSESSMENT:  CLINICAL IMPRESSION: Pt showed a better tolerance of exercises today. We focused more on anything to get movement in his lower back, trying hip ROM and postural strengthening. He had no increased pain with any interventions but also no decrease in pain. Added scap stab exercises with TB to HEP as he seemed to do ok with these exercises. D/t exercises seemingly producing no improvement and remaining high pain levels, ended session early to allow pt and interpreter to schedule appointment with Dr. Jordan Likes to discuss further options to decrease pain in his lower back.      OBJECTIVE IMPAIRMENTS Abnormal gait, decreased activity tolerance, decreased endurance, decreased knowledge of condition, decreased mobility, difficulty walking, decreased ROM, decreased strength, decreased safety awareness, increased fascial restrictions, impaired perceived functional ability, increased muscle spasms, impaired flexibility, impaired  sensation, improper body mechanics, postural dysfunction, and pain.   ACTIVITY LIMITATIONS carrying, lifting, bending, sitting, standing, squatting, sleeping, stairs, transfers, bed mobility, locomotion level, and caring for others  PARTICIPATION LIMITATIONS: meal prep, cleaning, laundry, medication management, interpersonal relationship, driving, shopping, community activity, occupation, and yard work  PERSONAL Insurance underwriter, Fitness, Past/current experiences, Profession, Time since onset of injury/illness/exacerbation, and 1-2 comorbidities: R wrist AVN/OA and h/o R forearm sx  are also affecting patient's functional outcome.   REHAB POTENTIAL: Good  CLINICAL DECISION MAKING: Evolving/moderate complexity  EVALUATION COMPLEXITY: Moderate   GOALS: Goals reviewed with patient? Yes  SHORT TERM GOALS: Target date: 06/12/2022   Patient will be independent with initial HEP.  Baseline: Initial HEP provided on eval  Goal status: IN PROGRESS  2.  Patient will report centralization of radicular symptoms.  Baseline: Near constant B LE sciatica with intermittent B LE numbness down posterior LEs to toes Goal status: IN PROGRESS  LONG TERM GOALS: Target date: 07/03/2022    Patient will be independent with advanced/ongoing HEP to improve outcomes and carryover.  Baseline: Initial HEP provided on eval  Goal status: IN PROGRESS  2.  Patient will report 75% improvement in low back pain to improve QOL.  Baseline: 9/10 Goal status: IN PROGRESS  3.  Patient to demonstrate ability to achieve and maintain good spinal alignment/posturing and body mechanics needed for daily activities. Baseline:  Goal status: IN PROGRESS  4.  Patient will demonstrate full pain free lumbar ROM to perform ADLs.   Baseline: Refer to above lumbar ROM chart Goal status: IN PROGRESS  5.  Patient will demonstrate improved B LE strength to >/= 4+/5 for improved stability and ease of mobility . Baseline: Refer to  above LE MMT chart Goal status: IN PROGRESS  6.  Patient will report 26 on lumbar FOTO to demonstrate  improved functional ability.  Baseline: 26 Goal status: IN PROGRESS   7.  Patient will report ability to play and kick a ball with his kids w/o limitation due to LBP or LE radiculopathy. Baseline: Unable to play with his kids d/t pain Goal status: IN PROGRESS  8.  Patient to report ability to perform ADLs, household, and work-related tasks without limitation due to LBP or LE radicular pain, LOM or weakness. Baseline: Limited with most daily tasks and unable to work d/t LBP/LE radiculopathy Goal status: IN PROGRESS    PLAN: PT FREQUENCY: 2x/week  PT DURATION: 6 weeks  PLANNED INTERVENTIONS: Therapeutic exercises, Therapeutic activity, Neuromuscular re-education, Balance training, Gait training, Patient/Family education, Joint mobilization, Aquatic Therapy, Dry Needling, Electrical stimulation, Spinal mobilization, Cryotherapy, Moist heat, Taping, Traction, Ultrasound, Ionotophoresis 4mg /ml Dexamethasone, Manual therapy, and Re-evaluation.  PLAN FOR NEXT SESSION: trial of estim/TENS during exercises; review initial HEP & progress/modify as indicated; core/lumbopelvic mobilization and strengthening as tolerated ; MT +/- DN and/or modalities   , PTA 06/09/2022, 3:27 PM

## 2022-06-11 ENCOUNTER — Ambulatory Visit: Payer: 59

## 2022-06-11 DIAGNOSIS — M5459 Other low back pain: Secondary | ICD-10-CM

## 2022-06-11 DIAGNOSIS — M5416 Radiculopathy, lumbar region: Secondary | ICD-10-CM

## 2022-06-11 DIAGNOSIS — M62830 Muscle spasm of back: Secondary | ICD-10-CM

## 2022-06-11 DIAGNOSIS — R29898 Other symptoms and signs involving the musculoskeletal system: Secondary | ICD-10-CM

## 2022-06-11 DIAGNOSIS — R2689 Other abnormalities of gait and mobility: Secondary | ICD-10-CM

## 2022-06-11 NOTE — Therapy (Signed)
OUTPATIENT PHYSICAL THERAPY TREATMENT   Patient Name: Kevin Church MRN: [id number] DOB:August 12, 1966, 56 y.o., male Today's Date: 06/11/2022   PT End of Session - 06/11/22 1027     Visit Number 5    Date for PT Re-Evaluation 07/03/22    Authorization Type [employer]    PT Start Time [phone]    PT Stop Time 1022    PT Time Calculation (min) 51 min    Activity Tolerance Patient tolerated treatment well;Patient limited by pain    Behavior During Therapy [facility] for tasks assessed/performed                History reviewed. No pertinent past medical history. 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) 04/01/2022   Facet arthropathy, lumbosacral 04/01/2022    PCP: Not in system & patient cannot recall name  REFERRING PROVIDER: Myra Rude, MD  REFERRING DIAG: [phone] (ICD-10-CM) - Facet arthropathy, lumbosacral  RATIONALE FOR EVALUATION AND TREATMENT:  Rehabilitation  THERAPY DIAG:  Other low back pain  Radiculopathy, lumbar region  Other symptoms and signs involving the musculoskeletal system  Muscle spasm of back  Other abnormalities of gait and mobility  ONSET DATE: ~1 month  SUBJECTIVE:                                                                                                                                                                                           SUBJECTIVE STATEMENT: Via spanish interpreter, pt reports that he mowed the lawn yesterday because it had to be done, later in the night he hurt a lot and still hurts this morning. After every session he notes more pain after coming in.   PAIN:  Are you having pain? Yes: NPRS scale: 8/10 Pain location: midline low back with pain and numbness down B posterior LEs to toes  Pain description: constant needles Aggravating  factors: bending forward, sit to stand, exercise, playing/kicking ball with his kids Relieving factors: ibuprofen, ice pack, Tiger balm, back brace  PERTINENT HISTORY:  R wrist AVN & OA - currently conservative treatment in wrist splint but being evaluated by ortho to determine potential procedural intervention, h/o R forearm sx  PRECAUTIONS: None  WEIGHT BEARING RESTRICTIONS: Yes - R wrist in splint due to OA/AVN  FALLS:  Has patient fallen in last 6 months? 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  Negative for fracture.  PATIENT SURVEYS:  FOTO Lumbar = 26, predicted = 65  SCREENING FOR RED FLAGS: Bowel or bladder incontinence: No Spinal tumors: No Cauda equina syndrome: No Compression fracture: No Abdominal aneurysm: No  COGNITION:  Overall cognitive status: Within functional limits for tasks assessed     SENSATION: WFL Intermittent B LE radicular pain and numbness down posterior LEs to toes  MUSCLE LENGTH:  Hamstrings: mod/severe tight B ITB: mod tight B Piriformis: mod tight B Hip flexors: mild tight B Quads: mild tight B  POSTURE: decreased lumbar lordosis  PALPATION: Increased muscle tension and TTP in B lower lumbar paraspinals, QL and to a lesser degree in B upper glutes Lower lumbar vertebrae hypomobile with CPAs with increased pain reported  LUMBAR ROM:   Active  AROM  Eval - 05/22/22  Flexion Hands to lower thighs - increased LBP & LE pain  Extension 50% limited - increased LBP but less LE pain  Right  lateral flexion Hand to lower thigh - increased LBP  Left lateral flexion Hand to lower thigh - increased LBP  Right rotation 50% limited - increased LBP   Left rotation 50% limited - increased LBP    (Blank rows = not tested)  LOWER EXTREMITY ROM:    B LE ROM grossly [facility]  LOWER EXTREMITY MMT:    MMT Right Eval 05/22/22 Left Eval 05/22/22  Hip flexion 4+ 4+  Hip extension 4 4  Hip abduction 4 4  Hip adduction 4 4  Hip internal rotation 4+ 4+  Hip external rotation 4 4  Knee flexion 4 4  Knee extension 4+ 4+  Ankle dorsiflexion 4 4  Ankle plantarflexion    Ankle inversion    Ankle eversion     (Blank rows = not tested) *Increased LBP with majority of resisted MMT  TODAY'S TREATMENT  06/11/22 Therapeutic Exercise: L1x44min Gait for 170 ft 4 trials - 1 seated break required d/t LBP  Manual Therapy: STM to bil thoracolumbar paraspinals  Modalities: Estim to bil thoracolumbar paraspinals, 80-150 Hz, intensity to pt tolerance  06/09/22 Therapeutic Exercise: Seated LAQ 2x10 bil Seated marching 2x10 bil Seated row red TB x 10 Seated shld extension red TB x 10 Seated hip ADD ball squeeze x 10 Seated row machine 15# x 10 Knee flexion 20# x 10  06/05/2022  Therapeutic Exercise: to improve strength and mobility.  Demo and verbal cues throughout for technique. Nustep L4 x 8 min (taking subjective also educating on dry needling during this time.) Seated TMR based exercses - upper trunk twist preference to L, x 10, x 5 - pain started to increase.  Arm raises preference L x 10, second set started to increased LBP again.  Leg raises - preference L - x 5.   Mechanical  Lumbar Traction x  20 min (15 min treatment time) started after 50 lbs max slowly increasing to 80lbs max and 60 lb min, 60 sec on/10 sec rest, to decrease radicular symptoms and pain.  2 steps up, 2 steps down, 20 deg decline.     PATIENT EDUCATION:  Education details: HEP update (seated row and extension) Person  educated:  Patient Education method: Explanation, Demonstration, Verbal cues, and Handouts  Education comprehension: verbalized understanding and needs further education   HOME EXERCISE PROGRAM: Access Code: [id number]  (Spanish translation)   ASSESSMENT:  CLINICAL IMPRESSION: Adian reported session noting that he mowed the lawn which seemed to increase his lower back pain. I advised him to at least pace himself when mowing and to do more of intervals rather than all at once since he is the option for mowing his lawn. We tried gait today for exercise in hopes to decrease LBP, he had one instance of increased pain so we took a rest break, then continued. Due to his pain levels remaining the same, did MT to help decrease muscle spasm and pain which did bring down his pain. Tried estim post session, explained the rationale and instructed pt on the entire process, during estim pt noted decreased pain to 6/10. He will see PT next visit and we discussed that if he still does not see any improvement to let us know to discuss further action with PT.     OBJECTIVE IMPAIRMENTS Abnormal gait, decreased activity tolerance, decreased endurance, decreased knowledge of condition, decreased mobility, difficulty walking, decreased ROM, decreased strength, decreased safety awareness, increased fascial restrictions, impaired perceived functional ability, increased muscle spasms, impaired flexibility, impaired sensation, improper body mechanics, postural dysfunction, and pain.   ACTIVITY LIMITATIONS carrying, lifting, bending, sitting, standing, squatting, sleeping, stairs, transfers, bed mobility, locomotion level, and caring for others  PARTICIPATION LIMITATIONS: meal prep, cleaning, laundry, medication management, interpersonal relationship, driving, shopping, community activity, occupation, and yard work  PERSONAL Firefighter, Fitness, Past/current experiences, Profession, Time since onset of  injury/illness/exacerbation, and 1-2 comorbidities: R wrist AVN/OA and h/o R forearm sx  are also affecting patient's functional outcome.   REHAB POTENTIAL: Good  CLINICAL DECISION MAKING: Evolving/moderate complexity  EVALUATION COMPLEXITY: Moderate   GOALS: Goals reviewed with patient? Yes  SHORT TERM GOALS: Target date: 06/12/2022   Patient will be independent with initial HEP.  Baseline: Initial HEP provided on eval  Goal status: IN PROGRESS - pt limited by pain with exercises - 06/11/22  2.  Patient will report centralization of radicular symptoms.  Baseline: Near constant B LE sciatica with intermittent B LE numbness down posterior LEs to toes Goal status: IN PROGRESS - pt reports constant pain - 06/11/22  LONG TERM GOALS: Target date: 07/03/2022    Patient will be independent with advanced/ongoing HEP to improve outcomes and carryover.  Baseline: Initial HEP provided on eval  Goal status: IN PROGRESS  2.  Patient will report 75% improvement in low back pain to improve QOL.  Baseline: 9/10 Goal status: IN PROGRESS  3.  Patient to demonstrate ability to achieve and maintain good spinal alignment/posturing and body mechanics needed for daily activities. Baseline:  Goal status: IN PROGRESS  4.  Patient will demonstrate full pain free lumbar ROM to perform ADLs.   Baseline: Refer to above lumbar ROM chart Goal status: IN PROGRESS  5.  Patient will demonstrate improved B LE strength to >/= 4+/5 for improved stability and ease of mobility . Baseline: Refer to above LE MMT chart Goal status: IN PROGRESS  6.  Patient will report 36 on lumbar FOTO to demonstrate improved functional ability.  Baseline: 26 Goal status: IN PROGRESS   7.  Patient will report ability to play and kick a ball with his kids w/o limitation due to LBP or LE radiculopathy. Baseline: Unable to play with his kids d/t pain Goal status:  IN PROGRESS  8.  Patient to report ability to perform ADLs,  household, and work-related tasks without limitation due to LBP or LE radicular pain, LOM or weakness. Baseline: Limited with most daily tasks and unable to work d/t LBP/LE radiculopathy Goal status: IN PROGRESS    PLAN: PT FREQUENCY: 2x/week  PT DURATION: 6 weeks  PLANNED INTERVENTIONS: Therapeutic exercises, Therapeutic activity, Neuromuscular re-education, Balance training, Gait training, Patient/Family education, Joint mobilization, Aquatic Therapy, Dry Needling, Electrical stimulation, Spinal mobilization, Cryotherapy, Moist heat, Taping, Traction, Ultrasound, Ionotophoresis 4mg /ml Dexamethasone, Manual therapy, and Re-evaluation.  PLAN FOR NEXT SESSION: trial of estim/TENS during exercises; review initial HEP & progress/modify as indicated; core/lumbopelvic mobilization and strengthening as tolerated ; MT +/- DN and/or modalities   Artist Pais, PTA 06/11/2022, 10:29 AM

## 2022-06-15 ENCOUNTER — Ambulatory Visit: Payer: 59 | Admitting: Physical Therapy

## 2022-06-18 ENCOUNTER — Ambulatory Visit: Payer: 59

## 2022-06-18 ENCOUNTER — Ambulatory Visit (HOSPITAL_BASED_OUTPATIENT_CLINIC_OR_DEPARTMENT_OTHER)
Admission: RE | Admit: 2022-06-18 | Discharge: 2022-06-18 | Disposition: A | Discharge status: Home / Self Care | Payer: 59 | Source: Ambulatory Visit | Attending: Family Medicine | Admitting: Family Medicine

## 2022-06-18 ENCOUNTER — Ambulatory Visit (INDEPENDENT_AMBULATORY_CARE_PROVIDER_SITE_OTHER): Payer: 59 | Admitting: Family Medicine

## 2022-06-18 ENCOUNTER — Encounter: Payer: Self-pay | Admitting: Family Medicine

## 2022-06-18 VITALS — BP 108/64 | Ht 66.0 in | Wt 175.0 lb

## 2022-06-18 DIAGNOSIS — R29898 Other symptoms and signs involving the musculoskeletal system: Secondary | ICD-10-CM

## 2022-06-18 DIAGNOSIS — M5416 Radiculopathy, lumbar region: Secondary | ICD-10-CM | POA: Diagnosis not present

## 2022-06-18 DIAGNOSIS — M5459 Other low back pain: Secondary | ICD-10-CM | POA: Diagnosis not present

## 2022-06-18 DIAGNOSIS — M62830 Muscle spasm of back: Secondary | ICD-10-CM

## 2022-06-18 DIAGNOSIS — R1031 Right lower quadrant pain: Secondary | ICD-10-CM | POA: Insufficient documentation

## 2022-06-18 DIAGNOSIS — R2689 Other abnormalities of gait and mobility: Secondary | ICD-10-CM

## 2022-06-18 DIAGNOSIS — M47817 Spondylosis without myelopathy or radiculopathy, lumbosacral region: Secondary | ICD-10-CM

## 2022-06-18 MED ORDER — GABAPENTIN 300 MG PO CAPS: 300.0000 mg | ORAL_CAPSULE | Freq: Three times a day (TID) | ORAL | 1 refills | Status: AC

## 2022-06-18 NOTE — Progress Notes (Signed)
  Kevin Church - 56 y.o. male MRN [id number]  Date of birth: 1966/07/05  SUBJECTIVE:  Including CC & ROS.  No chief complaint on file.   Kevin Church is a 56 y.o. male that is presenting with acute on chronic low back pain with radicular pain.  He has been experiencing this pain for several months now.  He has started physical therapy back in May with no relief.  He is unable to work or do things around his house.  He has low back pain that radiates to his groin.  An in person Spanish interpreter was used for this interview  Review of Systems See HPI   HISTORY: Past Medical, Surgical, Social, and Family History Reviewed & Updated per EMR.   Pertinent Historical Findings include:  History reviewed. No pertinent past medical history.  Past Surgical History:  Procedure Laterality Date   FOREARM SURGERY Right      PHYSICAL EXAM:  VS: BP 108/64 (BP Location: Left Arm, Patient Position: Sitting)   Ht 5\' 6"  (1.676 m)   Wt 175 lb (79.4 kg)   BMI 28.25 kg/m  Physical Exam Gen: NAD, alert, cooperative with exam, well-appearing MSK:  Back/left leg Pain with flexion and extension. Weakness with resistance to hip flexion on the left. Diminished deep tendon reflexes at the patella and Achilles. Positive straight leg raise Neurovascularly intact       ASSESSMENT & PLAN:   Lumbar radiculopathy Acute on chronic in nature.  He is having pain and altered sensation that radiates to the groin area.  As well as down the leg.  He has been in physical therapy beginning of May.  Still having pain despite medications. -Counseled on home exercise therapy and supportive care. -Gabapentin. -MRI of lumbar spine evaluate for nerve impingement and consideration of epidural use.  Facet arthropathy, lumbosacral Continues to have low back pain with changes observed on previous imaging. -Counseled on home exercise therapy and supportive care. -Hold on physical therapy. -Could  consider facet injections  Right inguinal pain Acutely occurring.  He does endorse pain that goes to the groin.  Possible for radicular pain versus coming from his hip joints. -Counseled on home exercise therapy and supportive care. -AP of the pelvis

## 2022-06-18 NOTE — Assessment & Plan Note (Signed)
Continues to have low back pain with changes observed on previous imaging. -Counseled on home exercise therapy and supportive care. -Hold on physical therapy. -Could consider facet injections

## 2022-06-18 NOTE — Assessment & Plan Note (Signed)
Acute on chronic in nature.  He is having pain and altered sensation that radiates to the groin area.  As well as down the leg.  He has been in physical therapy beginning of May.  Still having pain despite medications. -Counseled on home exercise therapy and supportive care. -Gabapentin. -MRI of lumbar spine evaluate for nerve impingement and consideration of epidural use.

## 2022-06-18 NOTE — Therapy (Addendum)
OUTPATIENT PHYSICAL THERAPY TREATMENT / DISCHARGE SUMMARY   Patient Name: Kevin Church MRN: [id number] DOB:[birth year], 56 y.o., male Today's Date: 06/18/2022   PT End of Session - 06/18/22 1055     Visit Number 6    Date for PT Re-Evaluation 07/03/22    Authorization Type [employer]    PT Start Time 1017    PT Stop Time 1051   pt requested to end early   PT Time Calculation (min) 34 min    Activity Tolerance Patient limited by pain    Behavior During Therapy [facility] for tasks assessed/performed                 History reviewed. No pertinent past medical history. 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   Avascular necrosis of bone of wrist ([place]) 04/01/2022   Facet arthropathy, lumbosacral 04/01/2022    PCP: Not in system & patient cannot recall name  REFERRING PROVIDER: Rosemarie Ax, MD  REFERRING DIAG: [phone] (ICD-10-CM) - Facet arthropathy, lumbosacral  RATIONALE FOR EVALUATION AND TREATMENT:  Rehabilitation  THERAPY DIAG:  Other low back pain  Radiculopathy, lumbar region  Other symptoms and signs involving the musculoskeletal system  Muscle spasm of back  Other abnormalities of gait and mobility  ONSET DATE: ~1 month  SUBJECTIVE:                                                                                                                                                                                           SUBJECTIVE STATEMENT: Via spanish interpreter, pt reports that he was working in the gutters when he bent over and hurt his back worse. Therapy still has not been helping much  PAIN:  Are you having pain? Yes: NPRS scale: 8/10 Pain location: midline low back with pain and numbness down B posterior LEs to toes  Pain description: constant needles Aggravating factors: bending forward, sit to stand, exercise,  playing/kicking ball with his kids Relieving factors: ibuprofen, ice pack, Tiger balm, back brace  PERTINENT HISTORY:  R wrist AVN & OA - currently conservative treatment in wrist splint but being evaluated by ortho to determine potential procedural intervention, h/o R forearm sx  PRECAUTIONS: None  WEIGHT BEARING RESTRICTIONS: Yes - R wrist in splint due to OA/AVN  FALLS:  Has patient fallen in last 6 months? 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  Negative for fracture.  PATIENT SURVEYS:  FOTO Lumbar = 26, predicted = 65  SCREENING FOR RED FLAGS: Bowel or bladder incontinence: No Spinal tumors: No Cauda equina syndrome: No Compression fracture: No Abdominal aneurysm: No  COGNITION:  Overall cognitive status: Within functional limits for tasks assessed     SENSATION: WFL Intermittent B LE radicular pain and numbness down posterior LEs to toes  MUSCLE LENGTH:  Hamstrings: mod/severe tight B ITB: mod tight B Piriformis: mod tight B Hip flexors: mild tight B Quads: mild tight B  POSTURE: decreased lumbar lordosis  PALPATION: Increased muscle tension and TTP in B lower lumbar paraspinals, QL and to a lesser degree in B upper glutes Lower lumbar vertebrae hypomobile with CPAs with increased pain reported  LUMBAR ROM:   Active  AROM  Eval - 05/22/22  Flexion Hands to lower thighs - increased LBP & LE pain  Extension 50% limited - increased LBP but less LE pain  Right lateral flexion Hand to lower thigh - increased  LBP  Left lateral flexion Hand to lower thigh - increased LBP  Right rotation 50% limited - increased LBP   Left rotation 50% limited - increased LBP    (Blank rows = not tested)  LOWER EXTREMITY ROM:    B LE ROM grossly WFL  LOWER EXTREMITY MMT:    MMT Right Eval 05/22/22 Left Eval 05/22/22  Hip flexion 4+ 4+  Hip extension 4 4  Hip abduction 4 4  Hip adduction 4 4  Hip internal rotation 4+ 4+  Hip external rotation 4 4  Knee flexion 4 4  Knee extension 4+ 4+  Ankle dorsiflexion 4 4  Ankle plantarflexion    Ankle inversion    Ankle eversion     (Blank rows = not tested) *Increased LBP with majority of resisted MMT  TODAY'S TREATMENT  720/23 Therapeutic Exercise: Gait for 200 ft - 1 seated break required d/t back pain  Manual Therapy: IASTM (foam roll) and STM to B lumbar paraspinals, glutes - pain requested to end MT and just do estim  Modalities: Modalities: Estim to bil thoracolumbar paraspinals, frequency: 80-150 Hz, intensity to pt tolerance (12) 06/11/22 Therapeutic Exercise: L1x55mn Gait for 170 ft 4 trials - 1 seated break required d/t LBP  Manual Therapy: STM to bil thoracolumbar paraspinals  Modalities: Estim to bil thoracolumbar paraspinals, 80-150 Hz, intensity to pt tolerance  06/09/22 Therapeutic Exercise: Seated LAQ 2x10 bil Seated marching 2x10 bil Seated row red TB x 10 Seated shld extension red TB x 10 Seated hip ADD ball squeeze x 10 Seated row machine 15# x 10 Knee flexion 20# x 10  06/05/2022  Therapeutic Exercise: to improve strength and mobility.  Demo and verbal cues throughout for technique. Nustep L4 x 8 min (taking subjective also educating on dry needling during this time.) Seated TMR based exercses - upper trunk twist preference to L, x 10, x 5 - pain started to increase.  Arm raises preference L x 10, second set started to increased LBP again.  Leg raises - preference L - x 5.   Mechanical  Lumbar Traction x  20 min (15 min  treatment time) started after 50 lbs max  slowly increasing to 80lbs max and 60 lb min, 60 sec on/10 sec rest, to decrease radicular symptoms and pain.  2 steps up, 2 steps down, 20 deg decline.     PATIENT EDUCATION:  Education details: HEP update (seated row and extension) Person educated: Patient Education method: Explanation, Demonstration, Verbal cues, and Handouts  Education comprehension: verbalized understanding and needs further education   HOME EXERCISE PROGRAM: Access Code: [email or web address]  (Spanish translation)   ASSESSMENT:  CLINICAL IMPRESSION: Pt arrived with c/o of continued pain in lower back down to coccyx. We tried gait as a warm up to reduce stiffness but this did not help the pain. We then tried STM to reduce muscle spasms but afterwards pt requested to just do estim because it did help with pain last visit. Applied estim to lower back for 9 min until pt noted that pain remained the same and requested to end session afterward. He will see Dr. Raeford Razor today @ 2:45, so far no TE, MT, or modalities seem to be producing any improvements.     OBJECTIVE IMPAIRMENTS Abnormal gait, decreased activity tolerance, decreased endurance, decreased knowledge of condition, decreased mobility, difficulty walking, decreased ROM, decreased strength, decreased safety awareness, increased fascial restrictions, impaired perceived functional ability, increased muscle spasms, impaired flexibility, impaired sensation, improper body mechanics, postural dysfunction, and pain.   ACTIVITY LIMITATIONS carrying, lifting, bending, sitting, standing, squatting, sleeping, stairs, transfers, bed mobility, locomotion level, and caring for others  PARTICIPATION LIMITATIONS: meal prep, cleaning, laundry, medication management, interpersonal relationship, driving, shopping, community activity, occupation, and yard work  PERSONAL Firefighter, Fitness, Past/current experiences, Profession, Time since onset of  injury/illness/exacerbation, and 1-2 comorbidities: R wrist AVN/OA and h/o R forearm sx  are also affecting patient's functional outcome.   REHAB POTENTIAL: Good  CLINICAL DECISION MAKING: Evolving/moderate complexity  EVALUATION COMPLEXITY: Moderate   GOALS: Goals reviewed with patient? Yes  SHORT TERM GOALS: Target date: 06/12/2022   Patient will be independent with initial HEP.  Baseline: Initial HEP provided on eval  Goal status: NOT MET - pt limited by pain with exercises - 06/11/22  2.  Patient will report centralization of radicular symptoms.  Baseline: Near constant B LE sciatica with intermittent B LE numbness down posterior LEs to toes Goal status: NOT MET - pt reports constant pain - 06/11/22  LONG TERM GOALS: Target date: 07/03/2022    Patient will be independent with advanced/ongoing HEP to improve outcomes and carryover.  Baseline: Initial HEP provided on eval  Goal status: NOT MET  2.  Patient will report 75% improvement in low back pain to improve QOL.  Baseline: 9/10 Goal status: NOT MET  3.  Patient to demonstrate ability to achieve and maintain good spinal alignment/posturing and body mechanics needed for daily activities. Baseline:  Goal status: NOT MET  4.  Patient will demonstrate full pain free lumbar ROM to perform ADLs.   Baseline: Refer to above lumbar ROM chart Goal status: NOT MET  5.  Patient will demonstrate improved B LE strength to >/= 4+/5 for improved stability and ease of mobility . Baseline: Refer to above LE MMT chart Goal status: NOT MET  6.  Patient will report 34 on lumbar FOTO to demonstrate improved functional ability.  Baseline: 26 Goal status: NOT MET   7.  Patient will report ability to play and kick a ball with his kids w/o limitation due to LBP or LE radiculopathy. Baseline: Unable to play with his kids d/t pain Goal status: NOT MET  8.  Patient to report ability to perform ADLs, household, and work-related tasks without  limitation due to LBP or LE radicular pain, LOM or weakness. Baseline: Limited with most daily tasks and unable to work d/t LBP/LE radiculopathy Goal status: NOT MET    PLAN: PT FREQUENCY: 2x/week  PT DURATION: 6 weeks  PLANNED INTERVENTIONS: Therapeutic exercises, Therapeutic activity, Neuromuscular re-education, Balance training, Gait training, Patient/Family education, Joint mobilization, Aquatic Therapy, Dry Needling, Electrical stimulation, Spinal mobilization, Cryotherapy, Moist heat, Taping, Traction, Ultrasound, Ionotophoresis 69m/ml Dexamethasone, Manual therapy, and Re-evaluation.  PLAN FOR NEXT SESSION: trial of estim/TENS during exercises; review initial HEP & progress/modify as indicated; core/lumbopelvic mobilization and strengthening as tolerated ; MT +/- DN and/or modalities   Kevin Church, PTA 06/18/2022, 11:00 AM   PHYSICAL THERAPY DISCHARGE SUMMARY  Visits from Start of Care: 6  Current functional level related to goals / functional outcomes:   Refer to above clinical impression and goal assessment for status as of last visit on 06/18/22. Patient saw MD following last PT visit and MD discontinued PT due to pt reporting no benefit, therefore will proceed with discharge from PT for this episode.     Remaining deficits:   Ongoing LBP/sacrococcygeal pain with limited activity tolerance not responding to PT interventions.   Education / Equipment:   HEP; info on home TENS unit  Patient agrees to discharge. Patient goals were not met. Patient is being discharged due to the physician's request.  JPercival Spanish PT, MPT 07/28/22, 1:33 PM  CB[facility] LLC[address] [place] [address]Phone: [phone]  Fax:  [phone]

## 2022-06-18 NOTE — Patient Instructions (Signed)
Good to see you You can place a hold on Physical therapy for now  Please start with 1 pill of gabapentin at night.  You can increase to 2-3 times daily as you tolerate. I will call with the x-ray results from today. We will get the MRI from the med center in [place]. Please send me a message in MyChart with any questions or updates.  We will set up a virtual visit once the MRI is resulted.   --Dr. Jordan Likes

## 2022-06-18 NOTE — Assessment & Plan Note (Signed)
Acutely occurring.  He does endorse pain that goes to the groin.  Possible for radicular pain versus coming from his hip joints. -Counseled on home exercise therapy and supportive care. -AP of the pelvis

## 2022-06-22 ENCOUNTER — Ambulatory Visit: Payer: 59 | Admitting: Physical Therapy

## 2022-06-23 ENCOUNTER — Ambulatory Visit (INDEPENDENT_AMBULATORY_CARE_PROVIDER_SITE_OTHER): Payer: 59 | Admitting: Orthopedic Surgery

## 2022-06-23 DIAGNOSIS — M8708 Idiopathic aseptic necrosis of bone, other site: Secondary | ICD-10-CM

## 2022-06-23 DIAGNOSIS — M19039 Primary osteoarthritis, unspecified wrist: Secondary | ICD-10-CM

## 2022-06-23 DIAGNOSIS — M19031 Primary osteoarthritis, right wrist: Secondary | ICD-10-CM

## 2022-06-23 DIAGNOSIS — M8700 Idiopathic aseptic necrosis of unspecified bone: Secondary | ICD-10-CM | POA: Diagnosis not present

## 2022-06-23 MED ORDER — BETAMETHASONE SOD PHOS & ACET 6 (3-3) MG/ML IJ SUSP
6.0000 mg | INTRAMUSCULAR | Status: AC | PRN
  Administered 2022-06-23: 6 mg via INTRA_ARTICULAR

## 2022-06-23 MED ORDER — LIDOCAINE HCL 1 % IJ SOLN
1.0000 mL | INTRAMUSCULAR | Status: AC | PRN
  Administered 2022-06-23: 1 mL

## 2022-06-23 NOTE — Progress Notes (Signed)
Office Visit Note   Patient: Kevin Church           Date of Birth: 1966/02/23           MRN: [id number] Visit Date: 06/23/2022              Requested by: No referring provider defined for this encounter. PCP: System, Provider Not In   Assessment & Plan: Visit Diagnoses:  1. Avascular necrosis of bone of wrist (HCC)   2. Wrist arthritis     Plan: Patient has continued pain in the wrist with radiolunate osteoarthritis with MRI findings of lunate collapse without fragmentation and significant cartilage loss at the proximal lunate and lunate fossa of the radius.  We again reviewed the treatment options for this problem including continued conservative management with bracing, oral anti-inflammatory medications, and trying a corticosteroid injection.  We also also discussed surgery with proximal row carpectomy with capsular interposition versus arthrodesis.  He would like to try corticosteroid injection for now as his previous injection gave him at least 6 months of her symptom relief.  I can see him back again when the symptoms return.  Follow-Up Instructions: No follow-ups on file.   Orders:  No orders of the defined types were placed in this encounter.  No orders of the defined types were placed in this encounter.     Procedures: Medium Joint Inj: R radiocarpal on 06/23/2022 5:25 PM Indications: joint swelling Details: 25 G 1.5 in needle, dorsal approach Medications: 1 mL lidocaine 1 %; 6 mg betamethasone acetate-betamethasone sodium phosphate 6 (3-3) MG/ML Procedure, treatment alternatives, risks and benefits explained, specific risks discussed. Consent was given by the patient. Immediately prior to procedure a time out was called to verify the correct patient, procedure, equipment, support staff and site/side marked as required. Patient was prepped and draped in the usual sterile fashion.       Clinical Data: No additional findings.   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  He was last seen about a month and a half ago which time the plan was for him to obtain the records from his previous surgery in [place].  He is unable to obtain his records as they need the release to be from a physician or physician's office.  Just to review, he fell on his right wrist in 2013 from the ceiling.  He is seen at the emergency room in [facility] and was told he had bone deformity with loss of blood flow to this bone.  He developed right wrist pain underwent corticosteroid injection in the wrist back in 20 15-20 16.  He had 6 months of relief.  He had some sort of surgery in [place] but he is not sure what.  He had an incision at the distal aspect of the forearm that seems consistent with at least a PIN neurectomy but is unsure what else he had done.  He has been living with the pain.  The pain is localized to the dorsal central aspect of the right wrist.  He is taking meloxicam with some symptom improvement.  He has a wet brace that he wears for support.    Review of Systems   Objective: Vital Signs: There were no vitals taken for this visit.  Physical Exam  Right Hand Exam  Tenderness  Right hand tenderness location: TTP at dorsal central wrist, around snuffbox, and volar wrist.  No swelling.  Range of Motion  Wrist  Right wrist extension: 60.  Right wrist flexion: 30.  Pronation:  normal  Supination:  normal       Specialty Comments:  No specialty comments available.  Imaging: No results found.   PMFS History: Patient Active Problem List   Diagnosis Date Noted   Lumbar radiculopathy 06/18/2022   Right inguinal pain 06/18/2022   Wrist arthritis 05/01/2022   Avascular necrosis of bone of wrist (HCC) 04/01/2022   Facet arthropathy, lumbosacral 04/01/2022    No past medical history on file.  No family history on file.  Past Surgical History:  Procedure Laterality Date   FOREARM SURGERY Right    Social History   Occupational History   Not on file  Tobacco Use   Smoking status: Not on file   Smokeless tobacco: Not on file  Substance and Sexual Activity   Alcohol use: Not on file   Drug use: Not on file   Sexual activity: Not on file

## 2022-06-25 ENCOUNTER — Encounter: Payer: 59 | Admitting: Physical Therapy

## 2022-07-01 ENCOUNTER — Other Ambulatory Visit: Payer: Self-pay | Admitting: Family Medicine

## 2022-07-01 MED ORDER — DIAZEPAM 5 MG PO TABS: ORAL_TABLET | ORAL | 0 refills | Status: AC

## 2022-07-01 NOTE — Progress Notes (Signed)
Provided valium for mri.   Myra Rude, MD [facility] 07/01/2022, 11:03 AM

## 2022-07-04 ENCOUNTER — Ambulatory Visit (INDEPENDENT_AMBULATORY_CARE_PROVIDER_SITE_OTHER): Payer: Commercial Managed Care - HMO

## 2022-07-04 DIAGNOSIS — M5416 Radiculopathy, lumbar region: Secondary | ICD-10-CM | POA: Diagnosis not present

## 2022-07-08 ENCOUNTER — Telehealth: Payer: Self-pay | Admitting: Family Medicine

## 2022-07-08 NOTE — Telephone Encounter (Signed)
Multiple message left for pt to contact [facility] for  MRI  results appt scheduling.  --glh

## 2022-07-17 ENCOUNTER — Ambulatory Visit: Payer: Commercial Managed Care - HMO | Admitting: Family Medicine

## 2022-07-17 ENCOUNTER — Encounter: Payer: Self-pay | Admitting: Family Medicine

## 2022-07-17 VITALS — BP 130/90 | Ht 66.0 in | Wt 175.0 lb

## 2022-07-17 DIAGNOSIS — M5416 Radiculopathy, lumbar region: Secondary | ICD-10-CM

## 2022-07-17 NOTE — Progress Notes (Signed)
  Humzah Hosollos-bamaca - 56 y.o. male MRN [id number]  Date of birth: 11-25-1966  SUBJECTIVE:  Including CC & ROS.  No chief complaint on file.   Montarius Hosollos-bamaca is a 56 y.o. male that is following up for the MRI of his lumbar spine.  Continues to have pain in the low back as well as the groin.  A telephone Spanish interpreter was used for this interview   Review of Systems See HPI   HISTORY: Past Medical, Surgical, Social, and Family History Reviewed & Updated per EMR.   Pertinent Historical Findings include:  History reviewed. No pertinent past medical history.  Past Surgical History:  Procedure Laterality Date   FOREARM SURGERY Right      PHYSICAL EXAM:  VS: BP (!) 130/90 (BP Location: Left Arm, Patient Position: Sitting)   Ht 5\' 6"  (1.676 m)   Wt 175 lb (79.4 kg)   BMI 28.25 kg/m  Physical Exam Gen: NAD, alert, cooperative with exam, well-appearing MSK:  Neurovascularly intact       ASSESSMENT & PLAN:   Lumbar radiculopathy Acutely occurring.  The MRI was reassuring but he does have a disc bulge at L4-5. -Counseled on home exercise therapy and supportive care. -CBC and CMP. -Pursue epidural.  May need to consider nerve study.

## 2022-07-17 NOTE — Patient Instructions (Signed)
Good to see you Please continue the gabapentin and mobic  We will get some baseline labs to make sure your kidney function is good  I have sent an order for an epidural   Please send me a message in MyChart with any questions or updates.  Please give me a call one week after the epidural to know if your pain is improved.   --Dr. Carlynn Spry bueno verte Por favor contine con la gabapentina y mobic Obtendremos algunos anlisis de referencia para asegurarnos de que su funcin renal sea buena. He enviado un pedido para una epidural. Enveme un mensaje en MyChart con cualquier pregunta o actualizacin. Llmeme una semana despus de la epidural para saber si su dolor ha mejorado.

## 2022-07-17 NOTE — Assessment & Plan Note (Signed)
Acutely occurring.  The MRI was reassuring but he does have a disc bulge at L4-5. -Counseled on home exercise therapy and supportive care. -CBC and CMP. -Pursue epidural.  May need to consider nerve study.

## 2022-07-18 LAB — CBC
Hematocrit: 42.3 % (ref 37.5–51.0)
Hemoglobin: 14.3 g/dL (ref 13.0–17.7)
MCH: 30.2 pg (ref 26.6–33.0)
MCHC: 33.8 g/dL (ref 31.5–35.7)
MCV: 89 fL (ref 79–97)
Platelets: 214 10*3/uL (ref 150–450)
RBC: 4.74 x10E6/uL (ref 4.14–5.80)
RDW: 13.4 % (ref 11.6–15.4)
WBC: 6.5 10*3/uL (ref 3.4–10.8)

## 2022-07-18 LAB — COMPREHENSIVE METABOLIC PANEL
ALT: 37 IU/L (ref 0–44)
AST: 20 IU/L (ref 0–40)
Albumin/Globulin Ratio: 2 (ref 1.2–2.2)
Albumin: 4.7 g/dL (ref 3.8–4.9)
Alkaline Phosphatase: 88 IU/L (ref 44–121)
BUN/Creatinine Ratio: 15 (ref 9–20)
BUN: 14 mg/dL (ref 6–24)
Bilirubin Total: 0.4 mg/dL (ref 0.0–1.2)
CO2: 22 mmol/L (ref 20–29)
Calcium: 9.7 mg/dL (ref 8.7–10.2)
Chloride: 102 mmol/L (ref 96–106)
Creatinine, Ser: 0.94 mg/dL (ref 0.76–1.27)
Globulin, Total: 2.4 g/dL (ref 1.5–4.5)
Glucose: 105 mg/dL — ABNORMAL HIGH (ref 70–99)
Potassium: 4.1 mmol/L (ref 3.5–5.2)
Sodium: 139 mmol/L (ref 134–144)
Total Protein: 7.1 g/dL (ref 6.0–8.5)
eGFR: 96 mL/min/{1.73_m2} (ref >59)

## 2022-07-20 ENCOUNTER — Telehealth: Payer: Self-pay | Admitting: Family Medicine

## 2022-07-20 NOTE — Telephone Encounter (Signed)
Informed of results.  Used a Research officer, trade union.  Myra Rude, MD [facility] 07/20/2022, 4:31 PM

## 2022-07-23 ENCOUNTER — Encounter: Payer: Self-pay | Admitting: Orthopedic Surgery

## 2022-07-23 ENCOUNTER — Ambulatory Visit: Payer: Commercial Managed Care - HMO | Admitting: Orthopedic Surgery

## 2022-07-23 DIAGNOSIS — M19039 Primary osteoarthritis, unspecified wrist: Secondary | ICD-10-CM

## 2022-07-24 NOTE — Progress Notes (Signed)
   Office Visit Note   Patient: Kevin Church           Date of Birth: [birth year]           MRN: [id number] Visit Date: 07/23/2022              Requested by: No referring provider defined for this encounter. PCP: System, Provider Not In   Assessment & Plan: Visit Diagnoses: No diagnosis found.  Plan: Patient has significant radiocarpal arthritis with sclerosis and collapse of the lunate without fragmentation.  He underwent CSI a month ago with very modest symptom relief.  Given that he underwent CSI so recently, we discussed that he needs to allow more time to pass before undergoing any surgical procedure if he were interested surgical wrist reconstruction.  He will continue to wear his brace and continue oral anti-inflammatory medication.   Follow-Up Instructions: No follow-ups on file.   Orders:  No orders of the defined types were placed in this encounter.  No orders of the defined types were placed in this encounter.     Procedures: No procedures performed   Clinical Data: No additional findings.   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 The pain remains at the dorsal central and radial wrist.  He underwent CSI around one month ago.  He remains symptomatic.         Review of Systems   Objective: Vital Signs: There were no vitals taken for this visit.  Physical Exam  Right Hand Exam   Tenderness  Right hand tenderness location: TTP at dorsal central and radial aspect of radiocarpal joint.  Mild to moderate swelling.  Other  Erythema: absent Sensation: normal Pulse: present  Comments:  Wrist ROM severely limited by pain.       Specialty Comments:  No specialty comments available.  Imaging: No results found.   PMFS History: Patient Active Problem List    Diagnosis Date Noted   Lumbar radiculopathy 06/18/2022   Right inguinal pain 06/18/2022   Wrist arthritis 05/01/2022   Avascular necrosis of bone of wrist (HCC) 04/01/2022   Facet arthropathy, lumbosacral 04/01/2022   History reviewed. No pertinent past medical history.  History reviewed. No pertinent family history.  Past Surgical History:  Procedure Laterality Date   FOREARM SURGERY Right    Social History   Occupational History   Not on file  Tobacco Use   Smoking status: Not on file   Smokeless tobacco: Not on file  Substance and Sexual Activity   Alcohol use: Not on file   Drug use: Not on file   Sexual activity: Not on file

## 2023-03-17 ENCOUNTER — Encounter: Payer: Self-pay | Admitting: *Deleted

## 2023-06-05 ENCOUNTER — Emergency Department (HOSPITAL_BASED_OUTPATIENT_CLINIC_OR_DEPARTMENT_OTHER)
Admission: EM | Admit: 2023-06-05 | Discharge: 2023-06-05 | Disposition: A | Discharge status: Home / Self Care | Payer: Commercial Managed Care - HMO | Attending: Emergency Medicine | Admitting: Emergency Medicine

## 2023-06-05 ENCOUNTER — Encounter (HOSPITAL_BASED_OUTPATIENT_CLINIC_OR_DEPARTMENT_OTHER): Payer: Self-pay

## 2023-06-05 ENCOUNTER — Emergency Department (HOSPITAL_BASED_OUTPATIENT_CLINIC_OR_DEPARTMENT_OTHER): Payer: Commercial Managed Care - HMO

## 2023-06-05 ENCOUNTER — Other Ambulatory Visit: Payer: Self-pay

## 2023-06-05 DIAGNOSIS — I10 Essential (primary) hypertension: Secondary | ICD-10-CM | POA: Insufficient documentation

## 2023-06-05 DIAGNOSIS — R0602 Shortness of breath: Secondary | ICD-10-CM | POA: Diagnosis not present

## 2023-06-05 DIAGNOSIS — R079 Chest pain, unspecified: Secondary | ICD-10-CM | POA: Diagnosis present

## 2023-06-05 DIAGNOSIS — R1013 Epigastric pain: Secondary | ICD-10-CM | POA: Insufficient documentation

## 2023-06-05 DIAGNOSIS — Z79899 Other long term (current) drug therapy: Secondary | ICD-10-CM | POA: Diagnosis not present

## 2023-06-05 HISTORY — DX: Essential (primary) hypertension: I10

## 2023-06-05 LAB — COMPREHENSIVE METABOLIC PANEL
ALT: 32 U/L (ref 0–44)
AST: 26 U/L (ref 15–41)
Albumin: 4.2 g/dL (ref 3.5–5.0)
Alkaline Phosphatase: 80 U/L (ref 38–126)
Anion gap: 10 (ref 5–15)
BUN: 21 mg/dL — ABNORMAL HIGH (ref 6–20)
CO2: 24 mmol/L (ref 22–32)
Calcium: 10.1 mg/dL (ref 8.9–10.3)
Chloride: 103 mmol/L (ref 98–111)
Creatinine, Ser: 0.88 mg/dL (ref 0.61–1.24)
GFR, Estimated: >60 mL/min (ref >60)
Glucose, Bld: 142 mg/dL — ABNORMAL HIGH (ref 70–99)
Potassium: 3.7 mmol/L (ref 3.5–5.1)
Sodium: 137 mmol/L (ref 135–145)
Total Bilirubin: 0.7 mg/dL (ref 0.3–1.2)
Total Protein: 7.3 g/dL (ref 6.5–8.1)

## 2023-06-05 LAB — CBC WITH DIFFERENTIAL/PLATELET
Abs Immature Granulocytes: 0.02 10*3/uL (ref 0.00–0.07)
Basophils Absolute: 0 10*3/uL (ref 0.0–0.1)
Basophils Relative: 1 %
Eosinophils Absolute: 0.2 10*3/uL (ref 0.0–0.5)
Eosinophils Relative: 3 %
HCT: 42.9 % (ref 39.0–52.0)
Hemoglobin: 14.3 g/dL (ref 13.0–17.0)
Immature Granulocytes: 0 %
Lymphocytes Relative: 49 %
Lymphs Abs: 3.4 10*3/uL (ref 0.7–4.0)
MCH: 29.3 pg (ref 26.0–34.0)
MCHC: 33.3 g/dL (ref 30.0–36.0)
MCV: 87.9 fL (ref 80.0–100.0)
Monocytes Absolute: 0.5 10*3/uL (ref 0.1–1.0)
Monocytes Relative: 7 %
Neutro Abs: 2.7 10*3/uL (ref 1.7–7.7)
Neutrophils Relative %: 40 %
Platelets: 223 10*3/uL (ref 150–400)
RBC: 4.88 MIL/uL (ref 4.22–5.81)
RDW: 13.6 % (ref 11.5–15.5)
WBC: 6.8 10*3/uL (ref 4.0–10.5)
nRBC: 0 % (ref 0.0–0.2)

## 2023-06-05 LAB — LIPASE, BLOOD: Lipase: 34 U/L (ref 11–51)

## 2023-06-05 LAB — BRAIN NATRIURETIC PEPTIDE: B Natriuretic Peptide: 57.4 pg/mL (ref 0.0–100.0)

## 2023-06-05 LAB — TROPONIN I (HIGH SENSITIVITY)
Troponin I (High Sensitivity): 4 ng/L (ref <18)
Troponin I (High Sensitivity): 4 ng/L (ref <18)

## 2023-06-05 MED ORDER — ALUM & MAG HYDROXIDE-SIMETH 200-200-20 MG/5ML PO SUSP
30.0000 mL | Freq: Once | ORAL | Status: AC
  Administered 2023-06-05: 30 mL via ORAL
  Filled 2023-06-05: qty 30

## 2023-06-05 MED ORDER — SUCRALFATE 1 G PO TABS: 1.0000 g | ORAL_TABLET | Freq: Three times a day (TID) | ORAL | 0 refills | Status: AC

## 2023-06-05 MED ORDER — PANTOPRAZOLE SODIUM 20 MG PO TBEC: 20.0000 mg | DELAYED_RELEASE_TABLET | Freq: Every day | ORAL | 0 refills | Status: AC

## 2023-06-05 MED ORDER — LIDOCAINE VISCOUS HCL 2 % MT SOLN
15.0000 mL | Freq: Once | OROMUCOSAL | Status: AC
  Administered 2023-06-05: 15 mL via ORAL
  Filled 2023-06-05: qty 15

## 2023-06-05 MED ORDER — SODIUM CHLORIDE 0.9 % IV BOLUS
1000.0000 mL | Freq: Once | INTRAVENOUS | Status: AC
  Administered 2023-06-05: 1000 mL via INTRAVENOUS

## 2023-06-05 MED ORDER — IOHEXOL 300 MG/ML  SOLN
75.0000 mL | Freq: Once | INTRAMUSCULAR | Status: AC | PRN
  Administered 2023-06-05: 75 mL via INTRAVENOUS

## 2023-06-05 MED ORDER — FAMOTIDINE IN NACL 20-0.9 MG/50ML-% IV SOLN
20.0000 mg | Freq: Once | INTRAVENOUS | Status: AC
  Administered 2023-06-05: 20 mg via INTRAVENOUS
  Filled 2023-06-05: qty 50

## 2023-06-05 NOTE — ED Notes (Signed)
Ambulated from lobby to room 1, SPO2 96-97%, HR 77-80. No increased WOB noted.  EKG completed in room.

## 2023-06-05 NOTE — ED Triage Notes (Signed)
Patient is having shortness of breath for a month. It got worse on Monday. Today he is stated her feels like something is wrong with his heart and short of breath.  His vision was "Turning off" for an hour on Wednesday.

## 2023-06-05 NOTE — ED Notes (Signed)
Pt transported to CT ?

## 2023-06-05 NOTE — ED Provider Notes (Signed)
[place] [facility] Provider Note  CSN: [id number] Arrival date & time: 06/05/23 4098  Chief Complaint(s) Shortness of Breath and Chest Pain  HPI Kevin Church is a 57 y.o. male with past medical history as below, significant for hypertension, Spanish-speaking who presents to the ED with complaint of chest pain abdominal pain Reports has been having intermittent symptoms over the past month or so.  Epigastric burning radiation to his midsternal but to his throat.  Difficulty eating or drinking time secondary to discomfort.  Having some burning sensation over his left chest wall as well.  Has not tried any medications at home for symptoms.  Feels sometimes the pain is worsened with food.  Sometimes when the pain becomes worse he has some difficulty breathing.  No change in bowel or bladder function, no melena or BRBPR.  No daily alcohol use, no daily tobacco use, no illicit drug use, no rashes.  No trauma.  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 Authorizing Provider  pantoprazole (PROTONIX) 20 MG tablet Take 1 tablet (20 mg total) by mouth daily for 14 days. 06/05/23 06/19/23 Yes Tanda Rockers A, DO  sucralfate (CARAFATE) 1 g tablet Take 1 tablet (1 g total) by mouth 4 (four) times daily -  with meals and at bedtime for 7 days. 06/05/23 06/12/23 Yes Tanda Rockers A, DO  diazepam (VALIUM) 5 MG tablet Please take 30-45 minutes prior to procedure. May repeat x 1. 07/01/22   Myra Rude, MD  gabapentin  (NEURONTIN) 300 MG capsule Take 1 capsule (300 mg total) by mouth 3 (three) times daily. 06/18/22   Myra Rude, MD  ibuprofen (ADVIL) 600 MG tablet Take 600 mg by mouth every 6 (six) hours as needed.    [provider]  meloxicam (MOBIC) 15 MG tablet One tab PO qAM with breakfast for 2 weeks, then daily prn pain. 04/01/22   Myra Rude, MD                                                                                                                                    Past Surgical History Past Surgical History:  Procedure Laterality Date   FOREARM SURGERY Right    Family History History reviewed. No pertinent family history.  Social History Social History   Tobacco Use   Smoking status: Never   Smokeless tobacco: Never  Substance Use Topics   Alcohol use: Not Currently   Drug  use: Not Currently   Allergies Patient has no known allergies.  Review of Systems Review of Systems  Constitutional:  Negative for chills and fever.  HENT:  Negative for facial swelling and trouble swallowing.   Eyes:  Negative for photophobia and visual disturbance.  Respiratory:  Positive for chest tightness and shortness of breath. Negative for cough.   Cardiovascular:  Positive for chest pain. Negative for palpitations.  Gastrointestinal:  Positive for abdominal pain and nausea. Negative for vomiting.  Endocrine: Negative for polydipsia and polyuria.  Genitourinary:  Negative for difficulty urinating and hematuria.  Musculoskeletal:  Negative for gait problem and joint swelling.  Skin:  Negative for pallor and rash.  Neurological:  Negative for syncope and headaches.  Psychiatric/Behavioral:  Negative for agitation and confusion.     Physical Exam Vital Signs  I have reviewed the triage vital signs BP 139/85   Pulse 60   Temp 98.7 F (37.1 C) (Oral)   Resp 16   Ht 5\' 6"  (1.676 m)   Wt 79.4 kg   SpO2 96%   BMI 28.25 kg/m  Physical Exam Vitals and nursing note  reviewed.  Constitutional:      General: He is not in acute distress.    Appearance: He is well-developed. He is obese.  HENT:     Head: Normocephalic and atraumatic.     Right Ear: External ear normal.     Left Ear: External ear normal.     Mouth/Throat:     Mouth: Mucous membranes are moist.  Eyes:     General: No scleral icterus. Cardiovascular:     Rate and Rhythm: Normal rate and regular rhythm.     Pulses: Normal pulses.     Heart sounds: Normal heart sounds.  Pulmonary:     Effort: Pulmonary effort is normal. No respiratory distress.     Breath sounds: Normal breath sounds. No stridor.  Abdominal:     General: Abdomen is flat.     Palpations: Abdomen is soft.     Tenderness: There is generalized abdominal tenderness and tenderness in the epigastric area. There is no guarding or rebound.       Comments: Nonperitoneal  Musculoskeletal:     Cervical back: No rigidity.     Right lower leg: No edema.     Left lower leg: No edema.  Skin:    General: Skin is warm and dry.     Capillary Refill: Capillary refill takes less than 2 seconds.  Neurological:     Mental Status: He is alert and oriented to person, place, and time.  Psychiatric:        Mood and Affect: Mood normal.        Behavior: Behavior normal.     ED Results and Treatments Labs (all labs ordered are listed, but only abnormal results are displayed) Labs Reviewed  COMPREHENSIVE METABOLIC PANEL - Abnormal; Notable for the following components:      Result Value   Glucose, Bld 142 (*)    BUN 21 (*)    All other components within normal limits  CBC WITH DIFFERENTIAL/PLATELET  BRAIN NATRIURETIC PEPTIDE  LIPASE, BLOOD  TROPONIN I (HIGH SENSITIVITY)  TROPONIN I (HIGH SENSITIVITY)  Radiology CT ABDOMEN PELVIS W CONTRAST  Result Date: 06/05/2023 CLINICAL DATA:  Epigastric pain EXAM: CT  ABDOMEN AND PELVIS WITH CONTRAST TECHNIQUE: Multidetector CT imaging of the abdomen and pelvis was performed using the standard protocol following bolus administration of intravenous contrast. RADIATION DOSE REDUCTION: This exam was performed according to the departmental dose-optimization program which includes automated exposure control, adjustment of the mA and/or kV according to patient size and/or use of iterative reconstruction technique. CONTRAST:  75mL OMNIPAQUE IOHEXOL 300 MG/ML  SOLN COMPARISON:  None Available. FINDINGS: Lower chest: Lung bases are clear. Hepatobiliary: Small benign cyst in the RIGHT scratch the small benign cysts in LEFT hepatic lobe. Gallbladder normal. Common bile duct normal. Pancreas: Pancreas is normal. No ductal dilatation. No pancreatic inflammation. Spleen: Normal spleen Adrenals/urinary tract: Adrenal glands and kidneys are normal. The ureters and bladder normal. Stomach/Bowel: Stomach, small bowel, appendix, and cecum are normal. The colon and rectosigmoid colon are normal. Vascular/Lymphatic: Abdominal aorta is normal caliber. No periportal or retroperitoneal adenopathy. No pelvic adenopathy. Reproductive: Prostate unremarkable Other: No free fluid. Musculoskeletal: No aggressive osseous lesion. IMPRESSION: 1. No acute findings in the abdomen pelvis. 2. Normal gallbladder and biliary tree. 3. Normal appendix. 4. No bowel inflammation or obstruction. Electronically Signed   By: Genevive Bi M.D.   On: 06/05/2023 10:45   DG Chest 2 View  Result Date: 06/05/2023 CLINICAL DATA:  Shortness of breath. EXAM: CHEST - 2 VIEW COMPARISON:  None Available. FINDINGS: Cardiac silhouette is normal in size and foot in size and configuration. Normal mediastinal and hilar contours. Clear lungs.  No pleural effusion or pneumothorax. Skeletal structures are unremarkable. IMPRESSION: No active cardiopulmonary disease. Electronically Signed   By: Amie Portland M.D.   On: 06/05/2023 10:12     Pertinent labs & imaging results that were available during my care of the patient were reviewed by me and considered in my medical decision making (see MDM for details).  Medications Ordered in ED Medications  sodium chloride 0.9 % bolus 1,000 mL (0 mLs Intravenous Stopped 06/05/23 0932)  alum & mag hydroxide-simeth (MAALOX/MYLANTA) 200-200-20 MG/5ML suspension 30 mL (30 mLs Oral Given 06/05/23 0828)    And  lidocaine (XYLOCAINE) 2 % viscous mouth solution 15 mL (15 mLs Oral Given 06/05/23 0828)  famotidine (PEPCID) IVPB 20 mg premix (0 mg Intravenous Stopped 06/05/23 0933)  iohexol (OMNIPAQUE) 300 MG/ML solution 75 mL (75 mLs Intravenous Contrast Given 06/05/23 0940)                                                                                                                                     Procedures Procedures  (including critical care time)  Medical Decision Making / ED Course    Medical Decision Making:    Hulen Laconte is a 57 y.o. male with past medical history as below, significant for hypertension, Spanish-speaking who presents to the ED with complaint of chest pain  abdominal pain. The complaint involves an extensive differential diagnosis and also carries with it a high risk of complications and morbidity.  Serious etiology was considered. Ddx includes but is not limited to: Differential includes all life-threatening causes for chest pain. This includes but is not exclusive to acute coronary syndrome, aortic dissection, pulmonary embolism, cardiac tamponade, community-acquired pneumonia, pericarditis, musculoskeletal chest wall pain, etc. Differential diagnosis includes but is not exclusive to acute cholecystitis, intrathoracic causes for epigastric abdominal pain, gastritis, duodenitis, pancreatitis, small bowel or large bowel obstruction, abdominal aortic aneurysm, hernia, gastritis, etc. Differential diagnosis includes but is not exclusive to acute appendicitis, renal  colic, testicular torsion, urinary tract infection, prostatitis,  diverticulitis, small bowel obstruction, colitis, abdominal aortic aneurysm, gastroenteritis, constipation etc.   Complete initial physical exam performed, notably the patient  was NAD, sitting upright, no hypoxia.    Reviewed and confirmed nursing documentation for past medical history, family history, social history.  Vital signs reviewed.   Spanish interpreter utilized Clinical Course as of 06/05/23 1157  Sat Jun 05, 2023  1051 Labs/imaging stable [SG]  1127 Feeling better [SG]  1151 Tolerating PO w/o difficulty, pain greatly improved.  [SG]    Clinical Course User Index [SG] Sloan Leiter, DO    He is feeling much better on recheck, he is able to tolerate p.o. intake without difficulty.  Abdomen is soft, nonperitoneal.  He is feeling much better.  HDS.  The patient's chest pain is not suggestive of pulmonary embolus, cardiac ischemia, aortic dissection, pericarditis, myocarditis, pulmonary embolism, pneumothorax, pneumonia, Zoster, or esophageal perforation, or other serious etiology.  Historically not abrupt in onset, tearing or ripping, pulses symmetric. EKG nonspecific for ischemia/infarction. No dysrhythmias, brugada, WPW, prolonged QT noted.   Troponin negative x2. CXR reviewed. Labs without demonstration of acute pathology unless otherwise noted above. Low HEART Score: 0-3 points (0.9-1.7% risk of MACE).  Symptoms today likely atypical, low risk chest pain.  Favor secondary to abdominal etiology.  Likely gastritis will start on PPI and Carafate, bland diet, follow-up with GI in 2 weeks  CP/ chest tightness has resolved   Given the extremely low risk of these diagnoses further testing and evaluation for these possibilities does not appear to be indicated at this time. Patient in no distress and overall condition improved here in the ED. Detailed discussions were had with the patient regarding current findings, and  need for close f/u with PCP or on call doctor. The patient has been instructed to return immediately if the symptoms worsen in any way for re-evaluation. Patient verbalized understanding and is in agreement with current care plan. All questions answered prior to discharge.        Additional history obtained: -Additional history obtained from spouse -External records from outside source obtained and reviewed including: Chart review including previous notes, labs, imaging, consultation notes including primary care documentation, medications   Lab Tests: -I ordered, reviewed, and interpreted labs.   The pertinent results include:   Labs Reviewed  COMPREHENSIVE METABOLIC PANEL - Abnormal; Notable for the following components:      Result Value   Glucose, Bld 142 (*)    BUN 21 (*)    All other components within normal limits  CBC WITH DIFFERENTIAL/PLATELET  BRAIN NATRIURETIC PEPTIDE  LIPASE, BLOOD  TROPONIN I (HIGH SENSITIVITY)  TROPONIN I (HIGH SENSITIVITY)    Notable for stable, trop neg x2  EKG   EKG Interpretation Date/Time:  Saturday June 05 2023 08:09:33 EDT Ventricular Rate:  67 PR  Interval:  174 QRS Duration:  91 QT Interval:  426 QTC Calculation: 450 R Axis:   68  Text Interpretation: Sinus rhythm no prior no stemi Confirmed by Tanda Rockers ([id number]) on 06/05/2023 8:45:12 AM         Imaging Studies ordered: I ordered imaging studies including ctap cxr I independently visualized the following imaging with scope of interpretation limited to determining acute life threatening conditions related to emergency care; findings noted above, significant for stable imaging, no ptx or hemoperitoneum  I independently visualized and interpreted imaging. I agree with the radiologist interpretation   Medicines ordered and prescription drug management: Meds ordered this encounter  Medications   sodium chloride 0.9 % bolus 1,000 mL   AND Linked Order Group    alum & mag  hydroxide-simeth (MAALOX/MYLANTA) 200-200-20 MG/5ML suspension 30 mL    lidocaine (XYLOCAINE) 2 % viscous mouth solution 15 mL   famotidine (PEPCID) IVPB 20 mg premix   iohexol (OMNIPAQUE) 300 MG/ML solution 75 mL   pantoprazole (PROTONIX) 20 MG tablet    Sig: Take 1 tablet (20 mg total) by mouth daily for 14 days.    Dispense:  14 tablet    Refill:  0   sucralfate (CARAFATE) 1 g tablet    Sig: Take 1 tablet (1 g total) by mouth 4 (four) times daily -  with meals and at bedtime for 7 days.    Dispense:  28 tablet    Refill:  0    -I have reviewed the patients home medicines and have made adjustments as needed   Consultations Obtained: na   Cardiac Monitoring: The patient was maintained on a cardiac monitor.  I personally viewed and interpreted the cardiac monitored which showed an underlying rhythm of: NSR  Social Determinants of Health:  Diagnosis or treatment significantly limited by social determinants of health: obesity No PCP   Reevaluation: After the interventions noted above, I reevaluated the patient and found that they have improved  Co morbidities that complicate the patient evaluation  Past Medical History:  Diagnosis Date   Hypertension       Dispostion: Disposition decision including need for hospitalization was considered, and patient discharged from emergency department.    Final Clinical Impression(s) / ED Diagnoses Final diagnoses:  Epigastric pain  Chest pain with low risk for cardiac etiology     This chart was dictated using voice recognition software.  Despite best efforts to proofread,  errors can occur which can change the documentation meaning.    Tanda Rockers A, DO 06/05/23 1157

## 2023-06-05 NOTE — Discharge Instructions (Addendum)
It was a pleasure caring for you today in the emergency department.  Please return to the emergency department for any worsening or worrisome symptoms.  Recommend you follow a bland diet over the next 2 weeks, that means no spicy food, fried or greasy food.  Avoid tobacco, alcohol, NSAIDs.  Please take medications as prescribed.  If you are still symptomatic after 2 weeks please call gastroenterology at number listed for follow-up appointment.  Please also call to establish care with PCP.  Please return for any worsening worrisome symptoms including severe abdominal pain, severe chest pain, difficulty breathing, bowel movement with blood or black stool.  Any worsening or worrisome symptoms.  ----------------------------------------   Fue un placer atenderle hoy en el departamento de emergencias.  Regrese al departamento de emergencias si cualquier sntoma que empeore o sea preocupante.  Le recomendamos seguir [employer] [address]as [address], es Designer, jewellery, nada de comida [place], [place].  Evite el tabaco, el alcohol y [place].  Por favor tome los medicamentos segn lo recetado.  Si todava tiene sntomas despus de 2 semanas, llame a gastroenterologa al nmero indicado para una cita de seguimiento.  Llame tambin para establecer atencin con el PCP.  Regrese si experimenta algn sntoma preocupante que empeore, incluido dolor abdominal intenso, dolor torcico intenso, dificultad para respirar, evacuaciones intestinales con sangre o heces negras.  Cualquier sntoma que empeore o sea preocupante.

## 2024-01-07 IMAGING — MR MR WRIST*R* W/ CM
5 of 6 series · 29 of 40 positions shown · IV contrast (agent unspecified)
Comparison: X-ray 04/01/2022

CLINICAL DATA: Right wrist pain. Injury in 6167. Avascular necrosis
of lunate

EXAM:
MRI OF THE RIGHT WRIST WITH CONTRAST (MR Arthrogram)
TECHNIQUE: Multiplanar, multisequence MR imaging of the wrist was performed
immediately following contrast injection into the radiocarpal joint
under fluoroscopic guidance. No intravenous contrast was
administered.

[Series 4: T1 fat-sat · axial · 3.0mm · 0.20mm/px · z∈[-45,+30]mm · 7 of 23 slices shown (1 of 2)]
[im 1/23]
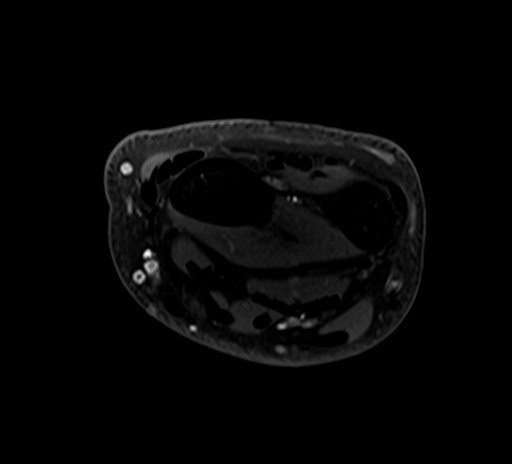
[im 4/23]
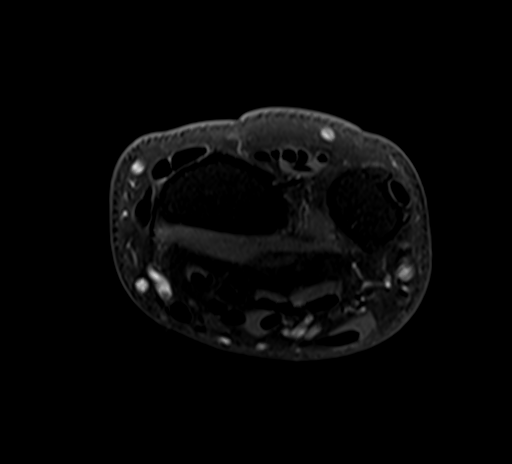
[im 8/23]
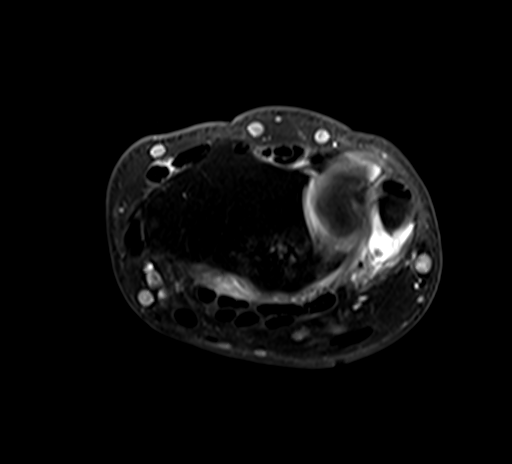
[im 12/23]
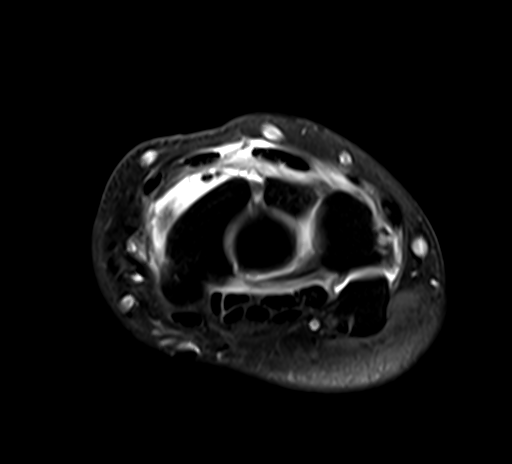
[im 15/23]
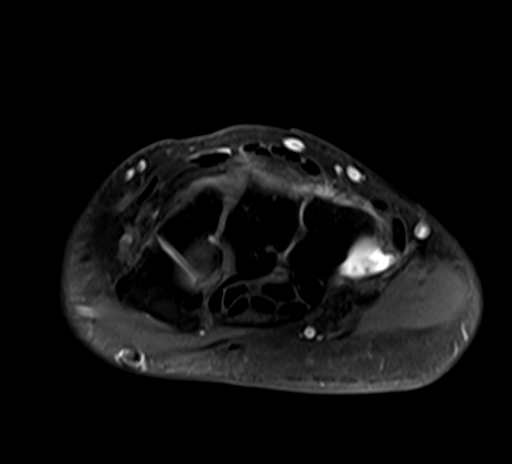
[im 19/23]
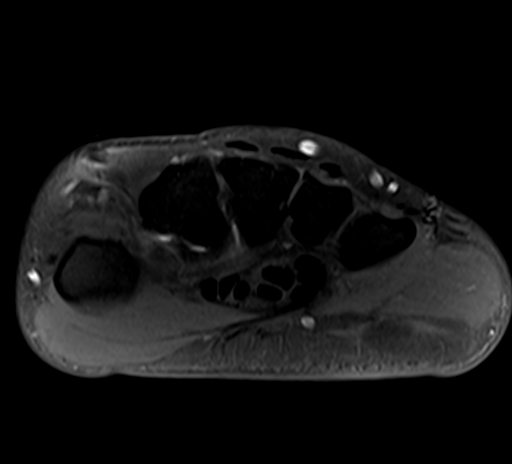
[im 23/23]
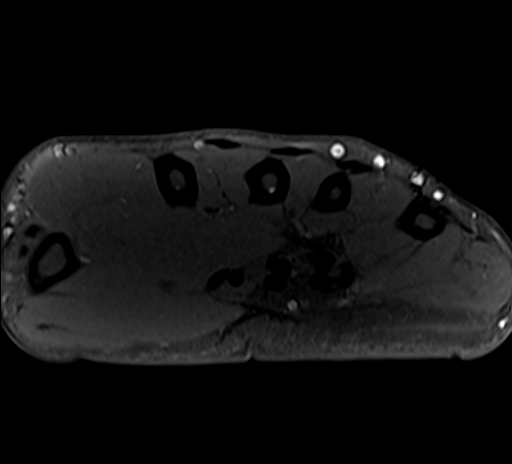

[Series 5: T2 fat-sat · axial · 3.0mm · 0.39mm/px · z∈[-45,+30]mm · 8 of 23 slices shown (1 of 3)]
[im 1/23]
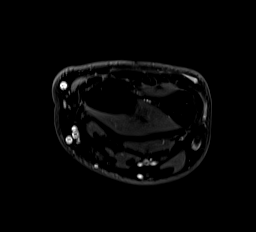
[im 4/23]
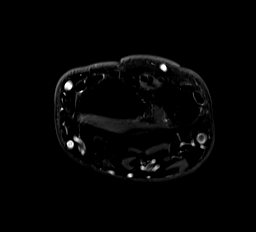
[im 7/23]
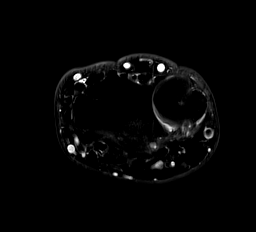
[im 10/23]
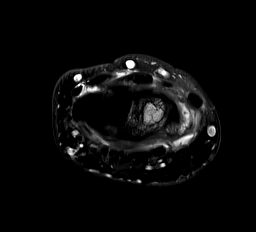
[im 13/23]
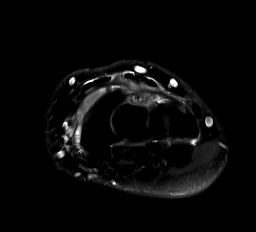
[im 16/23]
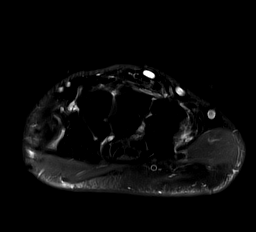
[im 19/23]
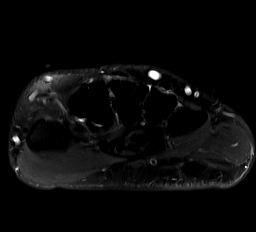
[im 23/23]
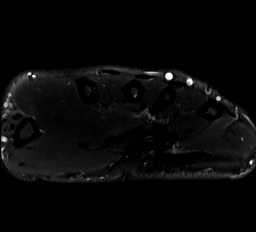

[Series 6: T1 fat-sat · coronal · 3.0mm · 0.20mm/px · 1 of 18 slices shown (2 of 2)]
[im 1/18]
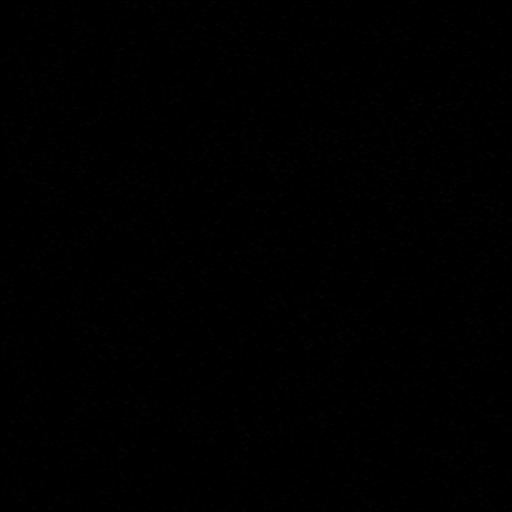

[Series 8: T2 fat-sat · coronal · 3.0mm · 0.39mm/px · 5 of 16 slices shown (2 of 3)]
[im 1/16]
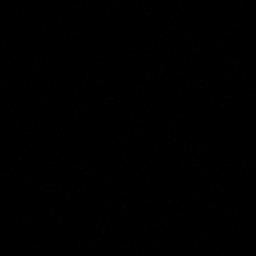
[im 4/16]
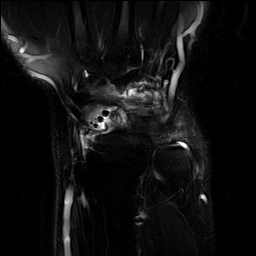
[im 8/16]
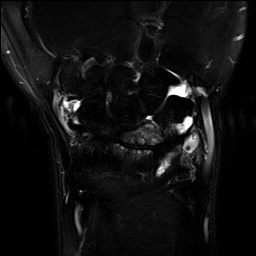
[im 12/16]
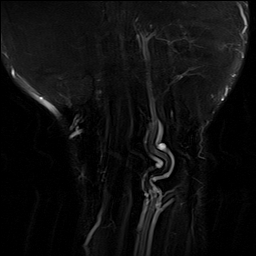
[im 16/16]
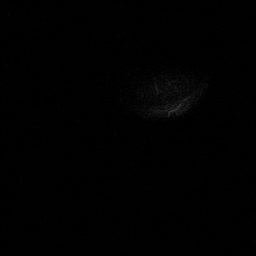

[Series 9: T2 fat-sat · sagittal · 3.0mm · 0.20mm/px · 8 of 24 slices shown (3 of 3)]
[im 1/24]
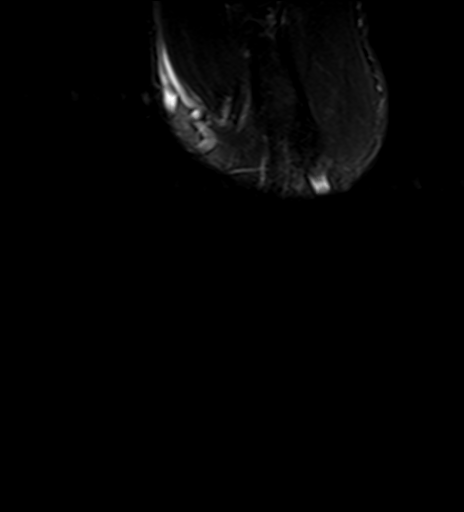
[im 4/24]
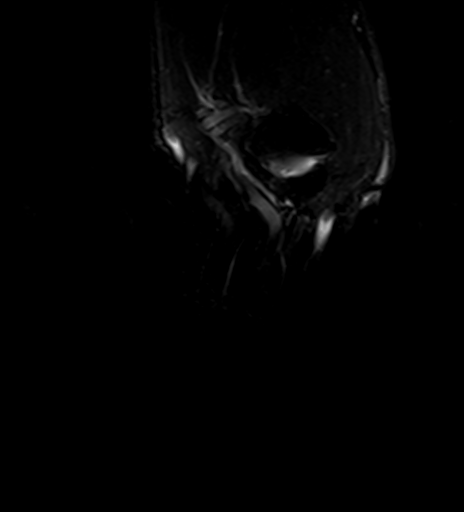
[im 7/24]
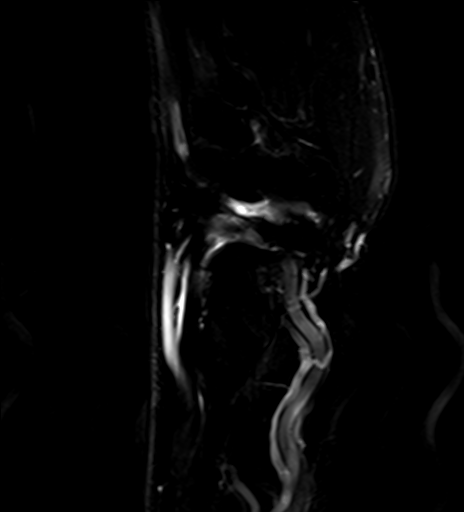
[im 10/24]
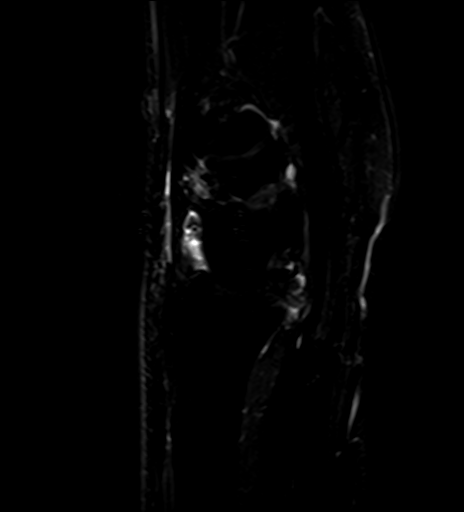
[im 14/24]
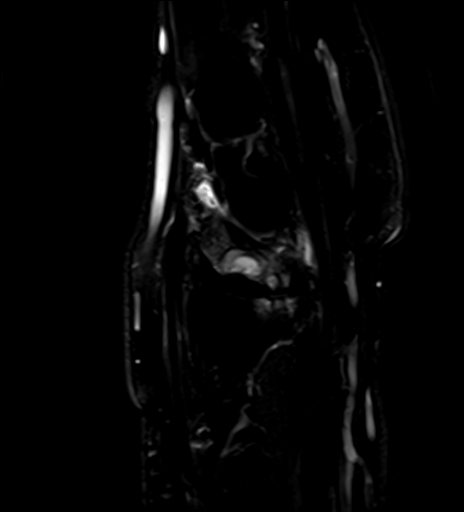
[im 17/24]
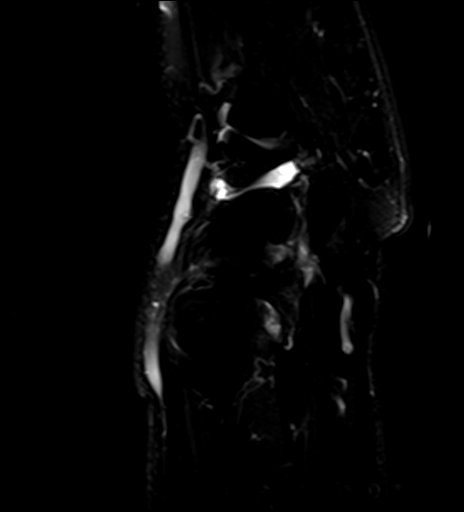
[im 20/24]
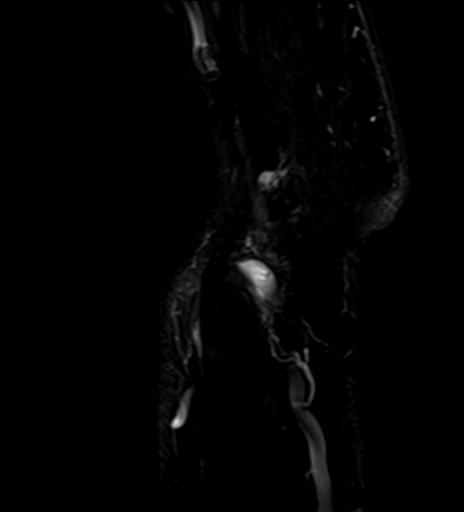
[im 24/24]
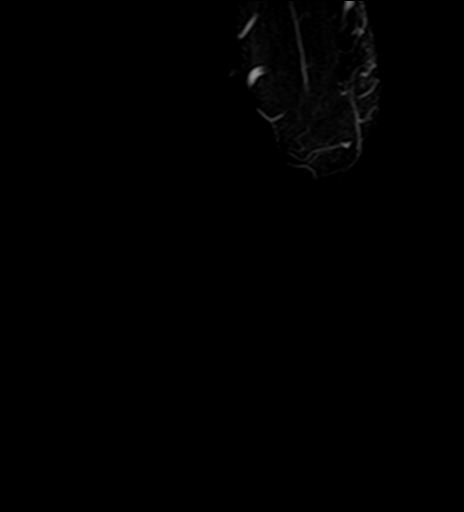

[29 of 40 positions shown; findings below may reference images not displayed]

FINDINGS: Ligaments: Degenerative tearing involving the volar band of the
scapholunate ligament and central portion of the lunotriquetral
ligament with extension of contrast into the midcarpal joint. Dorsal
and central components of the scapholunate ligament are intact.

Triangular fibrocartilage: Degenerative appearing tear of the
articular disc of the TFCC with extension of contrast into the DRUJ.

Tendons: Intact flexor and extensor compartment tendons without
tendinosis, tear, or tenosynovitis.

Carpal tunnel/median nerve: Normal carpal tunnel. Normal median
nerve.

Guyon's canal: Normal.

Joint/cartilage: Full-thickness cartilage loss along the proximal
surface of the lunate and within the distal radius at the lunate
fossa (series 8, image 9) with prominent subchondral cyst formation
along both sides of the joint. Remaining joint spaces of the wrist
are preserved.

Bones/carpal alignment: Prominent subchondral cyst formation
throughout the lunate with dominant 12 x 6 mm subchondral cyst
within the lunate. Bone marrow edema and intermediate T1 marrow
signal throughout the remaining portions of the lunate without
fracture or fragmentation. Numerous small subchondral cysts within
the distal radius at the lunate fossa. Osseous structures
demonstrate otherwise normal signal. No fracture or malalignment.

Other: No soft tissue edema or fluid collection.
IMPRESSION: 1. Severe degenerative changes of the radiocarpal joint at the
radiolunate articulation. Prominent subchondral cyst formation
throughout the lunate with surrounding bone marrow edema. No
fracture or fragmentation. Findings favored to be degenerative or
posttraumatic in etiology. Underlying avascular necrosis of the
lunate is also a consideration but felt to be less likely.
2. Degenerative tearing of the articular disc of the TFCC.
3. Degenerative tearing involving the volar band of the scapholunate
ligament and central portion of the lunotriquetral ligament with
extension of contrast into the midcarpal joint.

## 2024-08-01 ENCOUNTER — Encounter: Payer: Self-pay | Admitting: Sports Medicine
